# Patient Record
Sex: Male | Born: 1970 | Race: White | Hispanic: No | Smoking: Current every day smoker
Health system: Southern US, Community
[De-identification: ages and names within clinical notes are randomized; demographics above are authoritative.]

## PROBLEM LIST (undated history)

## (undated) DIAGNOSIS — G8929 Other chronic pain: Secondary | ICD-10-CM

## (undated) DIAGNOSIS — I10 Essential (primary) hypertension: Secondary | ICD-10-CM

## (undated) DIAGNOSIS — F419 Anxiety disorder, unspecified: Secondary | ICD-10-CM

## (undated) DIAGNOSIS — M549 Dorsalgia, unspecified: Secondary | ICD-10-CM

## (undated) DIAGNOSIS — R569 Unspecified convulsions: Secondary | ICD-10-CM

## (undated) DIAGNOSIS — F329 Major depressive disorder, single episode, unspecified: Secondary | ICD-10-CM

## (undated) DIAGNOSIS — R4589 Other symptoms and signs involving emotional state: Secondary | ICD-10-CM

## (undated) DIAGNOSIS — R4689 Other symptoms and signs involving appearance and behavior: Secondary | ICD-10-CM

## (undated) DIAGNOSIS — F32A Depression, unspecified: Secondary | ICD-10-CM

## (undated) HISTORY — DX: Depression, unspecified: F32.A

## (undated) HISTORY — DX: Major depressive disorder, single episode, unspecified: F32.9

---

## 2006-05-01 ENCOUNTER — Emergency Department (HOSPITAL_COMMUNITY): Admission: EM | Admit: 2006-05-01 | Discharge: 2006-05-02 | Payer: Self-pay | Admitting: Emergency Medicine

## 2006-08-31 ENCOUNTER — Emergency Department (HOSPITAL_COMMUNITY): Admission: EM | Admit: 2006-08-31 | Discharge: 2006-08-31 | Payer: Self-pay | Admitting: Emergency Medicine

## 2006-09-22 ENCOUNTER — Emergency Department (HOSPITAL_COMMUNITY): Admission: EM | Admit: 2006-09-22 | Discharge: 2006-09-23 | Payer: Self-pay | Admitting: *Deleted

## 2007-10-03 ENCOUNTER — Emergency Department (HOSPITAL_COMMUNITY): Admission: EM | Admit: 2007-10-03 | Discharge: 2007-10-03 | Payer: Self-pay | Admitting: Emergency Medicine

## 2007-12-28 ENCOUNTER — Emergency Department (HOSPITAL_COMMUNITY): Admission: EM | Admit: 2007-12-28 | Discharge: 2007-12-28 | Payer: Self-pay | Admitting: Emergency Medicine

## 2008-02-09 ENCOUNTER — Emergency Department (HOSPITAL_COMMUNITY): Admission: EM | Admit: 2008-02-09 | Discharge: 2008-02-10 | Payer: Self-pay | Admitting: Emergency Medicine

## 2008-02-10 ENCOUNTER — Ambulatory Visit: Payer: Self-pay | Admitting: *Deleted

## 2008-04-02 ENCOUNTER — Emergency Department (HOSPITAL_COMMUNITY): Admission: EM | Admit: 2008-04-02 | Discharge: 2008-04-03 | Payer: Self-pay | Admitting: Emergency Medicine

## 2008-12-14 ENCOUNTER — Emergency Department (HOSPITAL_COMMUNITY): Admission: EM | Admit: 2008-12-14 | Discharge: 2008-12-14 | Payer: Self-pay | Admitting: Emergency Medicine

## 2008-12-17 ENCOUNTER — Emergency Department (HOSPITAL_COMMUNITY): Admission: EM | Admit: 2008-12-17 | Discharge: 2008-12-18 | Payer: Self-pay | Admitting: Emergency Medicine

## 2009-01-30 ENCOUNTER — Ambulatory Visit (HOSPITAL_COMMUNITY): Payer: Self-pay | Admitting: Psychiatry

## 2009-06-05 ENCOUNTER — Encounter: Admission: RE | Admit: 2009-06-05 | Discharge: 2009-08-26 | Payer: Self-pay | Admitting: Neurology

## 2010-07-16 ENCOUNTER — Emergency Department (HOSPITAL_COMMUNITY): Admission: EM | Admit: 2010-07-16 | Discharge: 2010-07-16 | Payer: Self-pay | Admitting: Emergency Medicine

## 2010-07-18 ENCOUNTER — Emergency Department (HOSPITAL_BASED_OUTPATIENT_CLINIC_OR_DEPARTMENT_OTHER): Admission: EM | Admit: 2010-07-18 | Discharge: 2010-07-18 | Payer: Self-pay | Admitting: Emergency Medicine

## 2010-07-18 ENCOUNTER — Ambulatory Visit: Payer: Self-pay | Admitting: Diagnostic Radiology

## 2010-09-21 ENCOUNTER — Emergency Department (HOSPITAL_COMMUNITY)
Admission: EM | Admit: 2010-09-21 | Discharge: 2010-09-22 | Disposition: A | Discharge status: Other Acute Inpatient Hospital | Payer: Self-pay | Source: Home / Self Care | Admitting: Emergency Medicine

## 2010-09-22 ENCOUNTER — Ambulatory Visit: Payer: Self-pay | Admitting: Psychiatry

## 2010-09-22 ENCOUNTER — Inpatient Hospital Stay (HOSPITAL_COMMUNITY): Admission: AD | Admit: 2010-09-22 | Discharge: 2010-09-28 | Payer: Self-pay | Admitting: Psychiatry

## 2010-09-29 ENCOUNTER — Other Ambulatory Visit (HOSPITAL_COMMUNITY)
Admission: RE | Admit: 2010-09-29 | Discharge: 2010-10-12 | Payer: Self-pay | Source: Home / Self Care | Admitting: Psychiatry

## 2010-10-13 ENCOUNTER — Ambulatory Visit: Payer: Self-pay | Admitting: Psychiatry

## 2011-02-01 LAB — DIFFERENTIAL
Basophils Relative: 1 % (ref 0–1)
Eosinophils Absolute: 0.7 10*3/uL (ref 0.0–0.7)
Eosinophils Relative: 7 % — ABNORMAL HIGH (ref 0–5)
Lymphs Abs: 3.3 10*3/uL (ref 0.7–4.0)
Monocytes Absolute: 0.7 10*3/uL (ref 0.1–1.0)
Monocytes Relative: 7 % (ref 3–12)

## 2011-02-01 LAB — BASIC METABOLIC PANEL
CO2: 22 mEq/L (ref 19–32)
Calcium: 9.3 mg/dL (ref 8.4–10.5)
Chloride: 102 mEq/L (ref 96–112)
Glucose, Bld: 111 mg/dL — ABNORMAL HIGH (ref 70–99)
Potassium: 3.7 mEq/L (ref 3.5–5.1)
Sodium: 137 mEq/L (ref 135–145)

## 2011-02-01 LAB — CBC
HCT: 37.5 % — ABNORMAL LOW (ref 39.0–52.0)
Hemoglobin: 12.4 g/dL — ABNORMAL LOW (ref 13.0–17.0)
MCH: 27.8 pg (ref 26.0–34.0)
MCHC: 33.2 g/dL (ref 30.0–36.0)
MCV: 83.8 fL (ref 78.0–100.0)
RBC: 4.48 MIL/uL (ref 4.22–5.81)

## 2011-02-01 LAB — RAPID URINE DRUG SCREEN, HOSP PERFORMED
Amphetamines: NOT DETECTED
Barbiturates: NOT DETECTED
Opiates: POSITIVE — AB

## 2011-02-01 LAB — HEPATIC FUNCTION PANEL
ALT: 22 U/L (ref 0–53)
Alkaline Phosphatase: 79 U/L (ref 39–117)
Indirect Bilirubin: 0.3 mg/dL (ref 0.3–0.9)
Total Bilirubin: 0.4 mg/dL (ref 0.3–1.2)

## 2011-02-01 LAB — T3, FREE: T3, Free: 3.6 pg/mL (ref 2.3–4.2)

## 2011-02-04 LAB — DIFFERENTIAL
Basophils Absolute: 0.1 10*3/uL (ref 0.0–0.1)
Eosinophils Absolute: 0.8 10*3/uL — ABNORMAL HIGH (ref 0.0–0.7)
Eosinophils Relative: 9 % — ABNORMAL HIGH (ref 0–5)
Monocytes Absolute: 0.6 10*3/uL (ref 0.1–1.0)

## 2011-02-04 LAB — CBC
HCT: 36.8 % — ABNORMAL LOW (ref 39.0–52.0)
MCH: 26.1 pg (ref 26.0–34.0)
MCHC: 32.6 g/dL (ref 30.0–36.0)
MCV: 80 fL (ref 78.0–100.0)
Platelets: 322 10*3/uL (ref 150–400)
RDW: 16.2 % — ABNORMAL HIGH (ref 11.5–15.5)
WBC: 9.2 10*3/uL (ref 4.0–10.5)

## 2011-02-04 LAB — RAPID URINE DRUG SCREEN, HOSP PERFORMED
Amphetamines: NOT DETECTED
Barbiturates: NOT DETECTED
Benzodiazepines: POSITIVE — AB
Opiates: POSITIVE — AB
Tetrahydrocannabinol: NOT DETECTED

## 2011-02-04 LAB — URINALYSIS, ROUTINE W REFLEX MICROSCOPIC
Bilirubin Urine: NEGATIVE
Glucose, UA: NEGATIVE mg/dL
Hgb urine dipstick: NEGATIVE
Protein, ur: NEGATIVE mg/dL
Urobilinogen, UA: 0.2 mg/dL (ref 0.0–1.0)

## 2011-02-04 LAB — POCT I-STAT, CHEM 8
BUN: 15 mg/dL (ref 6–23)
Calcium, Ion: 1.16 mmol/L (ref 1.12–1.32)
Hemoglobin: 13.6 g/dL (ref 13.0–17.0)
Sodium: 139 mEq/L (ref 135–145)
TCO2: 24 mmol/L (ref 0–100)

## 2011-03-07 LAB — COMPREHENSIVE METABOLIC PANEL WITH GFR
Albumin: 4 g/dL (ref 3.5–5.2)
BUN: 13 mg/dL (ref 6–23)
Calcium: 8.9 mg/dL (ref 8.4–10.5)
Creatinine, Ser: 0.9 mg/dL (ref 0.4–1.5)
Potassium: 3.4 meq/L — ABNORMAL LOW (ref 3.5–5.1)
Total Protein: 6.3 g/dL (ref 6.0–8.3)

## 2011-03-07 LAB — RAPID URINE DRUG SCREEN, HOSP PERFORMED
Amphetamines: NOT DETECTED
Amphetamines: NOT DETECTED
Barbiturates: NOT DETECTED
Barbiturates: NOT DETECTED
Benzodiazepines: POSITIVE — AB
Cocaine: POSITIVE — AB
Cocaine: POSITIVE — AB
Opiates: POSITIVE — AB
Opiates: POSITIVE — AB
Tetrahydrocannabinol: NOT DETECTED
Tetrahydrocannabinol: NOT DETECTED

## 2011-03-07 LAB — URINALYSIS, ROUTINE W REFLEX MICROSCOPIC
Bilirubin Urine: NEGATIVE
Glucose, UA: NEGATIVE mg/dL
Hgb urine dipstick: NEGATIVE
Ketones, ur: NEGATIVE mg/dL
Nitrite: NEGATIVE
Protein, ur: NEGATIVE mg/dL
Specific Gravity, Urine: 1.022 (ref 1.005–1.030)
Urobilinogen, UA: 0.2 mg/dL (ref 0.0–1.0)
pH: 6.5 (ref 5.0–8.0)

## 2011-03-07 LAB — BASIC METABOLIC PANEL
CO2: 28 mEq/L (ref 19–32)
Calcium: 10.3 mg/dL (ref 8.4–10.5)
Chloride: 97 mEq/L (ref 96–112)
Creatinine, Ser: 1.12 mg/dL (ref 0.4–1.5)
Glucose, Bld: 110 mg/dL — ABNORMAL HIGH (ref 70–99)

## 2011-03-07 LAB — DIFFERENTIAL
Basophils Absolute: 0 10*3/uL (ref 0.0–0.1)
Basophils Absolute: 0 K/uL (ref 0.0–0.1)
Basophils Relative: 0 % (ref 0–1)
Basophils Relative: 1 % (ref 0–1)
Eosinophils Absolute: 0.2 10*3/uL (ref 0.0–0.7)
Eosinophils Absolute: 0.4 10*3/uL (ref 0.0–0.7)
Eosinophils Relative: 5 % (ref 0–5)
Lymphocytes Relative: 28 % (ref 12–46)
Lymphs Abs: 2.5 K/uL (ref 0.7–4.0)
Monocytes Absolute: 0.6 K/uL (ref 0.1–1.0)
Monocytes Absolute: 0.9 10*3/uL (ref 0.1–1.0)
Monocytes Relative: 7 % (ref 3–12)
Monocytes Relative: 8 % (ref 3–12)
Neutro Abs: 5.4 K/uL (ref 1.7–7.7)
Neutro Abs: 8.5 10*3/uL — ABNORMAL HIGH (ref 1.7–7.7)
Neutrophils Relative %: 61 % (ref 43–77)

## 2011-03-07 LAB — COMPREHENSIVE METABOLIC PANEL
ALT: 20 U/L (ref 0–53)
AST: 21 U/L (ref 0–37)
Alkaline Phosphatase: 54 U/L (ref 39–117)
CO2: 27 mEq/L (ref 19–32)
Chloride: 102 mEq/L (ref 96–112)
GFR calc Af Amer: >60 mL/min (ref >60)
GFR calc non Af Amer: >60 mL/min (ref >60)
Glucose, Bld: 88 mg/dL (ref 70–99)
Sodium: 137 mEq/L (ref 135–145)
Total Bilirubin: 0.7 mg/dL (ref 0.3–1.2)

## 2011-03-07 LAB — CBC
HCT: 36.9 % — ABNORMAL LOW (ref 39.0–52.0)
Hemoglobin: 12.4 g/dL — ABNORMAL LOW (ref 13.0–17.0)
Hemoglobin: 14.8 g/dL (ref 13.0–17.0)
MCHC: 33.2 g/dL (ref 30.0–36.0)
MCHC: 33.5 g/dL (ref 30.0–36.0)
MCV: 91.4 fL (ref 78.0–100.0)
MCV: 92 fL (ref 78.0–100.0)
Platelets: 293 K/uL (ref 150–400)
RBC: 4.01 MIL/uL — ABNORMAL LOW (ref 4.22–5.81)
RDW: 12.9 % (ref 11.5–15.5)
RDW: 13.4 % (ref 11.5–15.5)
WBC: 8.9 10*3/uL (ref 4.0–10.5)

## 2011-03-07 LAB — LIPASE, BLOOD: Lipase: 36 U/L (ref 11–59)

## 2011-03-23 ENCOUNTER — Emergency Department (HOSPITAL_COMMUNITY)
Admission: EM | Admit: 2011-03-23 | Discharge: 2011-03-24 | Disposition: A | Discharge status: Other Acute Inpatient Hospital | Payer: PRIVATE HEALTH INSURANCE | Attending: Emergency Medicine | Admitting: Emergency Medicine

## 2011-03-23 DIAGNOSIS — M545 Low back pain, unspecified: Secondary | ICD-10-CM | POA: Insufficient documentation

## 2011-03-23 DIAGNOSIS — I10 Essential (primary) hypertension: Secondary | ICD-10-CM | POA: Insufficient documentation

## 2011-03-23 DIAGNOSIS — F3289 Other specified depressive episodes: Secondary | ICD-10-CM | POA: Insufficient documentation

## 2011-03-23 DIAGNOSIS — F329 Major depressive disorder, single episode, unspecified: Secondary | ICD-10-CM | POA: Insufficient documentation

## 2011-03-23 DIAGNOSIS — G40909 Epilepsy, unspecified, not intractable, without status epilepticus: Secondary | ICD-10-CM | POA: Insufficient documentation

## 2011-03-23 LAB — URINALYSIS, ROUTINE W REFLEX MICROSCOPIC
Bilirubin Urine: NEGATIVE
Hgb urine dipstick: NEGATIVE
Ketones, ur: NEGATIVE mg/dL
Nitrite: NEGATIVE
Protein, ur: NEGATIVE mg/dL
Specific Gravity, Urine: 1.027 (ref 1.005–1.030)
Urobilinogen, UA: 0.2 mg/dL (ref 0.0–1.0)

## 2011-03-23 LAB — CBC
HCT: 37 % — ABNORMAL LOW (ref 39.0–52.0)
Hemoglobin: 12.4 g/dL — ABNORMAL LOW (ref 13.0–17.0)
MCV: 81.3 fL (ref 78.0–100.0)
RBC: 4.55 MIL/uL (ref 4.22–5.81)
RDW: 15.1 % (ref 11.5–15.5)
WBC: 10.8 10*3/uL — ABNORMAL HIGH (ref 4.0–10.5)

## 2011-03-23 LAB — DIFFERENTIAL
Basophils Absolute: 0.1 10*3/uL (ref 0.0–0.1)
Lymphocytes Relative: 28 % (ref 12–46)
Lymphs Abs: 3 10*3/uL (ref 0.7–4.0)
Neutro Abs: 6.2 10*3/uL (ref 1.7–7.7)
Neutrophils Relative %: 57 % (ref 43–77)

## 2011-03-23 LAB — RAPID URINE DRUG SCREEN, HOSP PERFORMED
Amphetamines: NOT DETECTED
Opiates: NOT DETECTED
Tetrahydrocannabinol: NOT DETECTED

## 2011-03-24 ENCOUNTER — Inpatient Hospital Stay (HOSPITAL_COMMUNITY)
Admission: AD | Admit: 2011-03-24 | Discharge: 2011-03-28 | DRG: 897 | Disposition: A | Discharge status: Home / Self Care | Payer: PRIVATE HEALTH INSURANCE | Source: Ambulatory Visit | Attending: Psychiatry | Admitting: Psychiatry

## 2011-03-24 DIAGNOSIS — G8929 Other chronic pain: Secondary | ICD-10-CM

## 2011-03-24 DIAGNOSIS — F3289 Other specified depressive episodes: Secondary | ICD-10-CM

## 2011-03-24 DIAGNOSIS — G9389 Other specified disorders of brain: Secondary | ICD-10-CM

## 2011-03-24 DIAGNOSIS — F1994 Other psychoactive substance use, unspecified with psychoactive substance-induced mood disorder: Secondary | ICD-10-CM

## 2011-03-24 DIAGNOSIS — Z87820 Personal history of traumatic brain injury: Secondary | ICD-10-CM

## 2011-03-24 DIAGNOSIS — F1411 Cocaine abuse, in remission: Secondary | ICD-10-CM

## 2011-03-24 DIAGNOSIS — F1120 Opioid dependence, uncomplicated: Principal | ICD-10-CM

## 2011-03-24 DIAGNOSIS — F1211 Cannabis abuse, in remission: Secondary | ICD-10-CM

## 2011-03-24 DIAGNOSIS — G40909 Epilepsy, unspecified, not intractable, without status epilepticus: Secondary | ICD-10-CM

## 2011-03-24 DIAGNOSIS — F329 Major depressive disorder, single episode, unspecified: Secondary | ICD-10-CM

## 2011-03-24 DIAGNOSIS — Z560 Unemployment, unspecified: Secondary | ICD-10-CM

## 2011-03-24 DIAGNOSIS — M549 Dorsalgia, unspecified: Secondary | ICD-10-CM

## 2011-03-24 DIAGNOSIS — F17200 Nicotine dependence, unspecified, uncomplicated: Secondary | ICD-10-CM

## 2011-03-24 LAB — COMPREHENSIVE METABOLIC PANEL
ALT: 24 U/L (ref 0–53)
Alkaline Phosphatase: 79 U/L (ref 39–117)
BUN: 13 mg/dL (ref 6–23)
Chloride: 105 mEq/L (ref 96–112)
Glucose, Bld: 132 mg/dL — ABNORMAL HIGH (ref 70–99)
Potassium: 4.3 mEq/L (ref 3.5–5.1)
Sodium: 139 mEq/L (ref 135–145)
Total Bilirubin: 0.1 mg/dL — ABNORMAL LOW (ref 0.3–1.2)
Total Protein: 7.2 g/dL (ref 6.0–8.3)

## 2011-03-24 LAB — ETHANOL: Alcohol, Ethyl (B): <11 mg/dL — ABNORMAL HIGH (ref 0–10)

## 2011-03-25 DIAGNOSIS — F1920 Other psychoactive substance dependence, uncomplicated: Secondary | ICD-10-CM

## 2011-03-25 DIAGNOSIS — F1994 Other psychoactive substance use, unspecified with psychoactive substance-induced mood disorder: Secondary | ICD-10-CM

## 2011-03-25 LAB — HEPATIC FUNCTION PANEL
ALT: 25 U/L (ref 0–53)
Alkaline Phosphatase: 78 U/L (ref 39–117)
Bilirubin, Direct: 0.1 mg/dL (ref 0.0–0.3)
Indirect Bilirubin: 0.2 mg/dL — ABNORMAL LOW (ref 0.3–0.9)
Total Protein: 7.1 g/dL (ref 6.0–8.3)

## 2011-03-25 LAB — HEMOGLOBIN A1C: Mean Plasma Glucose: 120 mg/dL — ABNORMAL HIGH (ref <117)

## 2011-03-25 LAB — TSH: TSH: 0.626 u[IU]/mL (ref 0.350–4.500)
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
## 2011-03-29 NOTE — Discharge Summary (Signed)
NAMEMICO, SPARK NO.:  [id number]  MEDICAL RECORD NO.:  [id number]           PATIENT TYPE:  I  LOCATION:  0305                          FACILITY:  BH  PHYSICIAN:  Anselm Jungling, MD  DATE OF BIRTH:  [birth year]  DATE OF ADMISSION:  03/24/2011 DATE OF DISCHARGE:  03/28/2011                              DISCHARGE SUMMARY   IDENTIFYING DATA AND REASON FOR ADMISSION:  This was an inpatient psychiatric admission for Sherley, a 40 year old male, who was admitted for stabilization of mood disorder, and substance dependence.  Please refer to the admission note prepared by Dr. Allena Katz, for further details pertaining to the symptoms, circumstances and history that led to his hospitalization.  MEDICAL AND LABORATORY:  The patient was medically and physically assessed by the psychiatric nurse practitioner.  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 In addition, from the same injury event, he had significant spinal damage resulting in chronic pain.  This was addressed with Neurontin, Lidoderm patch, and naproxen.  The patient had been receiving opiate analgesia through a pain clinic prior to admission.  It was not clear to what degree he had been abusing or misusing these substances, intentionally or otherwise.  The patient was also treated with risperidone, Keppra and carvedilol. There were no acute medical issues during his stay.  HOSPITAL COURSE:  The patient was admitted to the adult inpatient psychiatric service.  He presented as a well-nourished, normally- developed adult male, who was quite pleasant, and generally well- organized.  He had good insight into his issues, including those pertaining to his traumatic brain injury.  He participated in therapeutic groups and activities geared towards helping him acquire better coping skills, a  better understanding of his underlying disorders and dynamics, and the development of a sound aftercare plan.  He was a good participant throughout.  On the fifth hospital day, he appeared appropriate for discharge.  He had gone through a clonidine withdrawal protocol, and it had proceeded and completed uneventfully.  He agreed to the following aftercare plan.  AFTERCARE:  The patient was to follow-up with his usual provider, Cora Collum, with an appointment on the day following discharge, Mar 29, 2011, at 1:45 p.m., for medication management.  Also, he was to follow-up with his usual therapist, Colen Darling, to be arranged at the time of this dictation.  DISCHARGE MEDICATIONS: 1. Carvedilol 12.5 mg b.i.d. 2. Naproxen 500 mg b.i.d. 3. Risperidone 0.5 mg b.i.d. 4. Keppra XR 500 mg b.i.d.  DISCHARGE DIAGNOSES:  Axis I:  Opiate dependence, not otherwise specified.  Depressive disorder, not otherwise specified. Axis II:  Deferred. Axis III:  History of traumatic brain injury, spinal injury, with associated sequelae, chronic pain. Axis IV:  Stressors severe. Axis V:  Global Assessment of Functioning on discharge 50.  The suicide risk assessment was completed at the time of discharge and he was rated as having minimal risk.     Anselm Jungling, MD     SPB/MEDQ  D:  03/28/2011  T:  03/29/2011  Job:  [id number]  Electronically Signed by Geralyn Flash MD on 03/29/2011 10:51:18 AM

## 2011-08-15 LAB — SALICYLATE LEVEL: Salicylate Lvl: <4

## 2011-08-15 LAB — I-STAT 8, (EC8 V) (CONVERTED LAB)
BUN: 19
Glucose, Bld: 96
Hemoglobin: 14.3
Potassium: 4
Sodium: 138
TCO2: 26

## 2011-08-15 LAB — RAPID URINE DRUG SCREEN, HOSP PERFORMED
Amphetamines: NOT DETECTED
Barbiturates: NOT DETECTED

## 2011-08-15 LAB — ACETAMINOPHEN LEVEL: Acetaminophen (Tylenol), Serum: <10 — ABNORMAL LOW

## 2011-08-15 LAB — ETHANOL: Alcohol, Ethyl (B): <5

## 2011-08-30 LAB — DIFFERENTIAL
Basophils Absolute: 0
Lymphocytes Relative: 19
Neutro Abs: 7.1

## 2011-08-30 LAB — BASIC METABOLIC PANEL
BUN: 12
Calcium: 9.8
Creatinine, Ser: 1.03
GFR calc non Af Amer: >60
Glucose, Bld: 96

## 2011-08-30 LAB — URINALYSIS, ROUTINE W REFLEX MICROSCOPIC
Glucose, UA: NEGATIVE
Ketones, ur: NEGATIVE
Leukocytes, UA: NEGATIVE
Protein, ur: 100 — AB

## 2011-08-30 LAB — CBC
Platelets: 431 — ABNORMAL HIGH
RDW: 13

## 2011-08-30 LAB — RAPID URINE DRUG SCREEN, HOSP PERFORMED
Amphetamines: NOT DETECTED
Barbiturates: NOT DETECTED
Benzodiazepines: POSITIVE — AB
Cocaine: NOT DETECTED
Opiates: NOT DETECTED

## 2011-08-30 LAB — VALPROIC ACID LEVEL: Valproic Acid Lvl: <10 — ABNORMAL LOW

## 2012-01-01 ENCOUNTER — Encounter (HOSPITAL_COMMUNITY): Payer: Self-pay

## 2012-01-01 ENCOUNTER — Inpatient Hospital Stay (HOSPITAL_COMMUNITY)
Admission: EM | Admit: 2012-01-01 | Discharge: 2012-01-04 | DRG: 917 | Disposition: A | Discharge status: Home / Self Care | Payer: Medicare Other | Attending: Internal Medicine | Admitting: Internal Medicine

## 2012-01-01 ENCOUNTER — Emergency Department (HOSPITAL_COMMUNITY): Payer: Medicare Other

## 2012-01-01 ENCOUNTER — Other Ambulatory Visit: Payer: Self-pay

## 2012-01-01 DIAGNOSIS — F32A Depression, unspecified: Secondary | ICD-10-CM | POA: Diagnosis present

## 2012-01-01 DIAGNOSIS — Z87311 Personal history of (healed) other pathological fracture: Secondary | ICD-10-CM

## 2012-01-01 DIAGNOSIS — I10 Essential (primary) hypertension: Secondary | ICD-10-CM | POA: Diagnosis present

## 2012-01-01 DIAGNOSIS — F17200 Nicotine dependence, unspecified, uncomplicated: Secondary | ICD-10-CM | POA: Diagnosis present

## 2012-01-01 DIAGNOSIS — E876 Hypokalemia: Secondary | ICD-10-CM | POA: Diagnosis present

## 2012-01-01 DIAGNOSIS — T424X4A Poisoning by benzodiazepines, undetermined, initial encounter: Principal | ICD-10-CM | POA: Diagnosis present

## 2012-01-01 DIAGNOSIS — J45901 Unspecified asthma with (acute) exacerbation: Secondary | ICD-10-CM | POA: Diagnosis present

## 2012-01-01 DIAGNOSIS — D72829 Elevated white blood cell count, unspecified: Secondary | ICD-10-CM | POA: Diagnosis present

## 2012-01-01 DIAGNOSIS — F329 Major depressive disorder, single episode, unspecified: Secondary | ICD-10-CM | POA: Diagnosis present

## 2012-01-01 DIAGNOSIS — R0781 Pleurodynia: Secondary | ICD-10-CM | POA: Diagnosis present

## 2012-01-01 DIAGNOSIS — T43591A Poisoning by other antipsychotics and neuroleptics, accidental (unintentional), initial encounter: Secondary | ICD-10-CM | POA: Diagnosis present

## 2012-01-01 DIAGNOSIS — T424X1A Poisoning by benzodiazepines, accidental (unintentional), initial encounter: Secondary | ICD-10-CM | POA: Diagnosis present

## 2012-01-01 DIAGNOSIS — J189 Pneumonia, unspecified organism: Secondary | ICD-10-CM | POA: Diagnosis present

## 2012-01-01 DIAGNOSIS — T50901A Poisoning by unspecified drugs, medicaments and biological substances, accidental (unintentional), initial encounter: Secondary | ICD-10-CM

## 2012-01-01 DIAGNOSIS — F411 Generalized anxiety disorder: Secondary | ICD-10-CM | POA: Diagnosis present

## 2012-01-01 DIAGNOSIS — F419 Anxiety disorder, unspecified: Secondary | ICD-10-CM | POA: Diagnosis present

## 2012-01-01 HISTORY — DX: Unspecified convulsions: R56.9

## 2012-01-01 HISTORY — DX: Anxiety disorder, unspecified: F41.9

## 2012-01-01 HISTORY — DX: Other symptoms and signs involving emotional state: R45.89

## 2012-01-01 HISTORY — DX: Essential (primary) hypertension: I10

## 2012-01-01 HISTORY — DX: Other symptoms and signs involving appearance and behavior: R46.89

## 2012-01-01 LAB — COMPREHENSIVE METABOLIC PANEL
ALT: 45 U/L (ref 0–53)
AST: 27 U/L (ref 0–37)
Calcium: 9.7 mg/dL (ref 8.4–10.5)
Creatinine, Ser: 0.8 mg/dL (ref 0.50–1.35)
GFR calc Af Amer: >90 mL/min (ref >90)
Glucose, Bld: 94 mg/dL (ref 70–99)
Sodium: 143 mEq/L (ref 135–145)
Total Protein: 7.7 g/dL (ref 6.0–8.3)

## 2012-01-01 LAB — URINALYSIS, ROUTINE W REFLEX MICROSCOPIC
Glucose, UA: NEGATIVE mg/dL
Leukocytes, UA: NEGATIVE
Nitrite: NEGATIVE
Specific Gravity, Urine: 1.027 (ref 1.005–1.030)
pH: 6 (ref 5.0–8.0)

## 2012-01-01 LAB — CBC
MCV: 83.9 fL (ref 78.0–100.0)
Platelets: 388 10*3/uL (ref 150–400)
RBC: 4.84 MIL/uL (ref 4.22–5.81)
RDW: 13.6 % (ref 11.5–15.5)
WBC: 12 10*3/uL — ABNORMAL HIGH (ref 4.0–10.5)

## 2012-01-01 LAB — GLUCOSE, CAPILLARY
Glucose-Capillary: 101 mg/dL — ABNORMAL HIGH (ref 70–99)
Glucose-Capillary: 116 mg/dL — ABNORMAL HIGH (ref 70–99)

## 2012-01-01 LAB — RAPID URINE DRUG SCREEN, HOSP PERFORMED
Amphetamines: NOT DETECTED
Barbiturates: NOT DETECTED
Tetrahydrocannabinol: NOT DETECTED

## 2012-01-01 LAB — DIFFERENTIAL
Basophils Absolute: 0.1 10*3/uL (ref 0.0–0.1)
Lymphocytes Relative: 19 % (ref 12–46)
Lymphs Abs: 2.3 10*3/uL (ref 0.7–4.0)
Neutro Abs: 8.6 10*3/uL — ABNORMAL HIGH (ref 1.7–7.7)
Neutrophils Relative %: 71 % (ref 43–77)

## 2012-01-01 LAB — SALICYLATE LEVEL: Salicylate Lvl: <2 mg/dL — ABNORMAL LOW (ref 2.8–20.0)

## 2012-01-01 LAB — MRSA PCR SCREENING: MRSA by PCR: NEGATIVE

## 2012-01-01 MED ORDER — ONDANSETRON HCL 4 MG PO TABS: 4.0000 mg | ORAL_TABLET | Freq: Four times a day (QID) | ORAL | Status: DC | PRN

## 2012-01-01 MED ORDER — SODIUM CHLORIDE 0.9 % IV SOLN
INTRAVENOUS | Status: AC
  Administered 2012-01-01: 15:00:00 via INTRAVENOUS

## 2012-01-01 MED ORDER — DEXTROSE-NACL 5-0.45 % IV SOLN
INTRAVENOUS | Status: DC
  Administered 2012-01-01 – 2012-01-02 (×2): via INTRAVENOUS

## 2012-01-01 MED ORDER — SODIUM CHLORIDE 0.9 % IV SOLN
INTRAVENOUS | Status: DC
  Administered 2012-01-01 (×2): via INTRAVENOUS

## 2012-01-01 MED ORDER — ALBUTEROL SULFATE (5 MG/ML) 0.5% IN NEBU
2.5000 mg | INHALATION_SOLUTION | Freq: Four times a day (QID) | RESPIRATORY_TRACT | Status: DC
  Administered 2012-01-01 – 2012-01-02 (×4): 2.5 mg via RESPIRATORY_TRACT
  Filled 2012-01-01 (×3): qty 0.5

## 2012-01-01 MED ORDER — METOPROLOL SUCCINATE ER 50 MG PO TB24
50.0000 mg | ORAL_TABLET | Freq: Every day | ORAL | Status: DC
  Administered 2012-01-01 – 2012-01-04 (×4): 50 mg via ORAL
  Filled 2012-01-01 (×6): qty 1

## 2012-01-01 MED ORDER — LEVETIRACETAM ER 500 MG PO TB24
1000.0000 mg | ORAL_TABLET | Freq: Every day | ORAL | Status: DC
  Administered 2012-01-01 – 2012-01-04 (×4): 1000 mg via ORAL
  Filled 2012-01-01 (×5): qty 2

## 2012-01-01 MED ORDER — LEVOFLOXACIN IN D5W 750 MG/150ML IV SOLN
750.0000 mg | INTRAVENOUS | Status: DC
  Administered 2012-01-01 – 2012-01-02 (×2): 750 mg via INTRAVENOUS
  Filled 2012-01-01 (×3): qty 150

## 2012-01-01 MED ORDER — METOPROLOL TARTRATE 1 MG/ML IV SOLN
5.0000 mg | Freq: Four times a day (QID) | INTRAVENOUS | Status: DC | PRN
  Administered 2012-01-01 – 2012-01-02 (×2): 5 mg via INTRAVENOUS
  Filled 2012-01-01 (×3): qty 5

## 2012-01-01 MED ORDER — ENOXAPARIN SODIUM 40 MG/0.4ML ~~LOC~~ SOLN
40.0000 mg | SUBCUTANEOUS | Status: DC
  Administered 2012-01-01 – 2012-01-03 (×3): 40 mg via SUBCUTANEOUS
  Filled 2012-01-01 (×5): qty 0.4

## 2012-01-01 MED ORDER — BUDESONIDE-FORMOTEROL FUMARATE 80-4.5 MCG/ACT IN AERO
2.0000 | INHALATION_SPRAY | Freq: Two times a day (BID) | RESPIRATORY_TRACT | Status: DC
  Administered 2012-01-01 – 2012-01-04 (×4): 2 via RESPIRATORY_TRACT
  Filled 2012-01-01: qty 6.9

## 2012-01-01 MED ORDER — ALBUTEROL SULFATE (5 MG/ML) 0.5% IN NEBU: 2.5000 mg | INHALATION_SOLUTION | RESPIRATORY_TRACT | Status: DC | PRN

## 2012-01-01 MED ORDER — ONDANSETRON HCL 4 MG/2ML IJ SOLN: 4.0000 mg | Freq: Four times a day (QID) | INTRAMUSCULAR | Status: DC | PRN

## 2012-01-01 MED ORDER — IPRATROPIUM BROMIDE 0.02 % IN SOLN
0.5000 mg | Freq: Four times a day (QID) | RESPIRATORY_TRACT | Status: DC
  Administered 2012-01-01 – 2012-01-02 (×4): 0.5 mg via RESPIRATORY_TRACT
  Filled 2012-01-01 (×3): qty 2.5

## 2012-01-01 NOTE — ED Notes (Signed)
Pt got the xanax prescription Friday night. Family member found pt around 1000 this AM lethargic, and the prescription bottle is almost empty. Dad states pt took 94pill so 0.5 xanax. Pt is lethargic but following command.
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
## 2012-01-02 ENCOUNTER — Inpatient Hospital Stay (HOSPITAL_COMMUNITY): Payer: Medicare Other

## 2012-01-02 DIAGNOSIS — F329 Major depressive disorder, single episode, unspecified: Secondary | ICD-10-CM | POA: Diagnosis present

## 2012-01-02 DIAGNOSIS — D72829 Elevated white blood cell count, unspecified: Secondary | ICD-10-CM | POA: Diagnosis present

## 2012-01-02 DIAGNOSIS — I10 Essential (primary) hypertension: Secondary | ICD-10-CM | POA: Diagnosis present

## 2012-01-02 DIAGNOSIS — F32A Depression, unspecified: Secondary | ICD-10-CM | POA: Diagnosis present

## 2012-01-02 DIAGNOSIS — F419 Anxiety disorder, unspecified: Secondary | ICD-10-CM | POA: Diagnosis present

## 2012-01-02 DIAGNOSIS — J189 Pneumonia, unspecified organism: Secondary | ICD-10-CM | POA: Diagnosis present

## 2012-01-02 DIAGNOSIS — E876 Hypokalemia: Secondary | ICD-10-CM | POA: Diagnosis present

## 2012-01-02 LAB — COMPREHENSIVE METABOLIC PANEL
AST: 19 U/L (ref 0–37)
CO2: 22 mEq/L (ref 19–32)
Calcium: 8.7 mg/dL (ref 8.4–10.5)
Creatinine, Ser: 0.82 mg/dL (ref 0.50–1.35)
GFR calc Af Amer: >90 mL/min (ref >90)
GFR calc non Af Amer: >90 mL/min (ref >90)

## 2012-01-02 LAB — CBC
Hemoglobin: 11.1 g/dL — ABNORMAL LOW (ref 13.0–17.0)
MCH: 27.9 pg (ref 26.0–34.0)
RBC: 3.98 MIL/uL — ABNORMAL LOW (ref 4.22–5.81)
WBC: 12.2 10*3/uL — ABNORMAL HIGH (ref 4.0–10.5)

## 2012-01-02 LAB — GLUCOSE, CAPILLARY: Glucose-Capillary: 125 mg/dL — ABNORMAL HIGH (ref 70–99)

## 2012-01-02 MED ORDER — ACETAMINOPHEN 325 MG PO TABS
650.0000 mg | ORAL_TABLET | Freq: Four times a day (QID) | ORAL | Status: DC | PRN
  Administered 2012-01-02 (×2): 650 mg via ORAL
  Filled 2012-01-02 (×2): qty 2

## 2012-01-02 MED ORDER — ALPRAZOLAM ER 0.5 MG PO TB24
0.5000 mg | ORAL_TABLET | Freq: Every day | ORAL | Status: DC
  Administered 2012-01-02 – 2012-01-04 (×3): 0.5 mg via ORAL
  Filled 2012-01-02 (×3): qty 1

## 2012-01-02 MED ORDER — POTASSIUM CHLORIDE CRYS ER 20 MEQ PO TBCR
40.0000 meq | EXTENDED_RELEASE_TABLET | Freq: Once | ORAL | Status: AC
  Administered 2012-01-02: 40 meq via ORAL
  Filled 2012-01-02 (×2): qty 1

## 2012-01-02 MED ORDER — MORPHINE SULFATE 2 MG/ML IJ SOLN
2.0000 mg | INTRAMUSCULAR | Status: DC | PRN
  Administered 2012-01-02 – 2012-01-03 (×4): 2 mg via INTRAVENOUS
  Administered 2012-01-03: 4 mg via INTRAVENOUS
  Administered 2012-01-03: 2 mg via INTRAVENOUS
  Filled 2012-01-02: qty 1
  Filled 2012-01-02: qty 2
  Filled 2012-01-02 (×4): qty 1

## 2012-01-02 NOTE — Progress Notes (Signed)
Subjective: Patient is awake without any complains.  He states that he feels fine.   Objective: Weight change:   Intake/Output Summary (Last 24 hours) at 01/02/12 0849 Last data filed at 01/02/12 0848  Gross per 24 hour  Intake   1930 ml  Output   1820 ml  Net    110 ml    Filed Vitals:   01/02/12 0800  BP: 153/93  Pulse: 81  Temp:   Resp: 25   Gen:  Patient awake and appears his stated age. CV: RRR Lungs CTA bilaterally ABD: pos BS nontender EXT: no edema  Lab Results: Reviewed  Micro Results: Recent Results (from the past 240 hour(s))  MRSA PCR SCREENING     Status: Normal   Collection Time   01/01/12  6:12 PM      Component Value Range Status Comment   MRSA by PCR NEGATIVE  NEGATIVE  Final     Studies/Results: Dg Chest Port 1 View  01/01/2012  *RADIOLOGY REPORT*  Clinical Data: Chest pain, cough  PORTABLE CHEST - 1 VIEW  Comparison: 07/18/2010  Findings: Left basilar opacity, atelectasis versus pneumonia. Increased interstitial markings without frank interstitial edema. No pleural effusion or pneumothorax.  The heart is top normal in size.  IMPRESSION: Left basilar opacity, atelectasis versus pneumonia.  Original Report Authenticated By: Charline Bills, M.D.   Medications: Scheduled Meds:   . sodium chloride   Intravenous STAT  . albuterol  2.5 mg Nebulization Q6H  . budesonide-formoterol  2 puff Inhalation BID  . enoxaparin  40 mg Subcutaneous Q24H  . ipratropium  0.5 mg Nebulization Q6H  . levETIRAcetam  1,000 mg Oral Daily  . levofloxacin (LEVAQUIN) IV  750 mg Intravenous Q24H  . metoprolol succinate  50 mg Oral Daily   Continuous Infusions:   . dextrose 5 % and 0.45% NaCl 100 mL/hr at 01/02/12 0547  . DISCONTD: sodium chloride Stopped (01/01/12 1932)   PRN Meds:.acetaminophen, albuterol, metoprolol, ondansetron (ZOFRAN) IV, ondansetron  Assessment/Plan: Benzodiazepine overdose (01/01/2012) Psych consult pending.  He is on chronic Benzo, to  prevent withdrawal will give low dose xanax when awake and alert daily.  Anxiety (01/02/2012)/Depression (01/02/2012) On low dose xanax  HTN (hypertension) (01/02/2012) Continue Toprol  Pneumonia (01/02/2012) On Levaquin  Hypokalemia (01/02/2012) Replete  Back Pain Get X_Ray of the back,  This problem has been going on for quite some time per pt.  Disp: Transfer to Tele    LOS: 1 day   [name] 01/02/2012, 8:49 AM
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
## 2012-01-02 NOTE — Progress Notes (Signed)
Spoke with Pt's father who was not in a place where he felt comfortable speaking with CSW.  Father to call CSW in the morning to discuss Pt.  CSW to continue to follow.

## 2012-01-02 NOTE — Progress Notes (Signed)
Received pt from icu with sitter at side.  Safety eval of room complete.  Pt orient to room call bell at side.
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
## 2012-01-03 DIAGNOSIS — F411 Generalized anxiety disorder: Secondary | ICD-10-CM

## 2012-01-03 DIAGNOSIS — F17200 Nicotine dependence, unspecified, uncomplicated: Secondary | ICD-10-CM

## 2012-01-03 LAB — CBC
MCHC: 33.5 g/dL (ref 30.0–36.0)
MCV: 82.2 fL (ref 78.0–100.0)
Platelets: 315 10*3/uL (ref 150–400)
RDW: 13.4 % (ref 11.5–15.5)
WBC: 11 10*3/uL — ABNORMAL HIGH (ref 4.0–10.5)

## 2012-01-03 LAB — BASIC METABOLIC PANEL
BUN: 7 mg/dL (ref 6–23)
Calcium: 8.7 mg/dL (ref 8.4–10.5)
Creatinine, Ser: 0.87 mg/dL (ref 0.50–1.35)
GFR calc Af Amer: >90 mL/min (ref >90)

## 2012-01-03 MED ORDER — SODIUM CHLORIDE 0.9 % IV SOLN: 250.0000 mL | INTRAVENOUS | Status: DC | PRN

## 2012-01-03 MED ORDER — LEVOFLOXACIN 750 MG PO TABS
750.0000 mg | ORAL_TABLET | Freq: Every day | ORAL | Status: DC
  Administered 2012-01-03 – 2012-01-04 (×2): 750 mg via ORAL
  Filled 2012-01-03 (×3): qty 1

## 2012-01-03 MED ORDER — SODIUM CHLORIDE 0.9 % IJ SOLN
3.0000 mL | Freq: Two times a day (BID) | INTRAMUSCULAR | Status: DC
  Administered 2012-01-03 – 2012-01-04 (×2): 3 mL via INTRAVENOUS

## 2012-01-03 MED ORDER — HYDROCODONE-ACETAMINOPHEN 5-325 MG PO TABS
1.0000 | ORAL_TABLET | ORAL | Status: DC | PRN
  Administered 2012-01-03 – 2012-01-04 (×5): 1 via ORAL
  Filled 2012-01-03 (×5): qty 1

## 2012-01-03 MED ORDER — SODIUM CHLORIDE 0.9 % IJ SOLN: 3.0000 mL | INTRAMUSCULAR | Status: DC | PRN

## 2012-01-03 MED ORDER — IPRATROPIUM BROMIDE 0.02 % IN SOLN
0.5000 mg | Freq: Three times a day (TID) | RESPIRATORY_TRACT | Status: DC
  Administered 2012-01-03 – 2012-01-04 (×3): 0.5 mg via RESPIRATORY_TRACT
  Filled 2012-01-03 (×5): qty 2.5

## 2012-01-03 MED ORDER — INFLUENZA VIRUS VACC SPLIT PF IM SUSP
0.5000 mL | Freq: Once | INTRAMUSCULAR | Status: AC
  Administered 2012-01-03: 0.5 mL via INTRAMUSCULAR
  Filled 2012-01-03: qty 0.5

## 2012-01-03 MED ORDER — ALBUTEROL SULFATE (5 MG/ML) 0.5% IN NEBU
2.5000 mg | INHALATION_SOLUTION | Freq: Three times a day (TID) | RESPIRATORY_TRACT | Status: DC
  Administered 2012-01-03 – 2012-01-04 (×3): 2.5 mg via RESPIRATORY_TRACT
  Filled 2012-01-03 (×5): qty 0.5
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
## 2012-01-03 NOTE — Progress Notes (Signed)
Subjective: Patient is awake and complains of back pain today. He fell in the past.   Objective: Weight change:   Intake/Output Summary (Last 24 hours) at 01/03/12 1534 Last data filed at 01/03/12 1236  Gross per 24 hour  Intake   2835 ml  Output    300 ml  Net   2535 ml    Filed Vitals:   01/03/12 1440  BP: 148/92  Pulse: 94  Temp: 97.8 F (36.6 C)  Resp: 22   Gen:  Patient awake and appears his stated age. CV: RRR Lungs CTA bilaterally ABD: pos BS nontender EXT: no edema  Lab Results: Reviewed  Micro Results: Recent Results (from the past 240 hour(s))  MRSA PCR SCREENING     Status: Normal   Collection Time   01/01/12  6:12 PM      Component Value Range Status Comment   MRSA by PCR NEGATIVE  NEGATIVE  Final     Studies/Results: Dg Chest Port 1 View  01/01/2012  *RADIOLOGY REPORT*  Clinical Data: Chest pain, cough  PORTABLE CHEST - 1 VIEW  Comparison: 07/18/2010  Findings: Left basilar opacity, atelectasis versus pneumonia. Increased interstitial markings without frank interstitial edema. No pleural effusion or pneumothorax.  The heart is top normal in size.  IMPRESSION: Left basilar opacity, atelectasis versus pneumonia.  Original Report Authenticated By: Charline Bills, M.D.   Medications: Scheduled Meds:    . albuterol  2.5 mg Nebulization TID  . ALPRAZolam  0.5 mg Oral Daily  . budesonide-formoterol  2 puff Inhalation BID  . enoxaparin  40 mg Subcutaneous Q24H  . ipratropium  0.5 mg Nebulization TID  . levETIRAcetam  1,000 mg Oral Daily  . levofloxacin  750 mg Oral Daily  . metoprolol succinate  50 mg Oral Daily  . sodium chloride  3 mL Intravenous Q12H  . DISCONTD: albuterol  2.5 mg Nebulization Q6H  . DISCONTD: ipratropium  0.5 mg Nebulization Q6H  . DISCONTD: levofloxacin (LEVAQUIN) IV  750 mg Intravenous Q24H   Continuous Infusions:    . DISCONTD: dextrose 5 % and 0.45% NaCl 75 mL/hr at 01/02/12 1028   PRN Meds:.sodium chloride,  acetaminophen, albuterol, HYDROcodone-acetaminophen, metoprolol, ondansetron (ZOFRAN) IV, ondansetron, sodium chloride, DISCONTD:  morphine injection  Assessment/Plan: Benzodiazepine overdose (01/01/2012) Psych consult recommend inpatient treatment.  He is on chronic Benzo, to prevent withdrawal will continue low dose xanax when awake and alert daily.  Anxiety (01/02/2012)/Depression (01/02/2012) On low dose xanax  HTN (hypertension) (01/02/2012) Continue Toprol  Pneumonia (01/02/2012) On Levaquin changed to by mouth  Hypokalemia (01/02/2012) Resolve  Back Pain X-ray shows compression fractures. When necessary Vicodin  Disp: To [facility] when bed available.    LOS: 2 days   Lanny Lipkin JARRETT 01/03/2012, 3:34 PM

## 2012-01-03 NOTE — Consult Note (Signed)
Reason for Consult:Suicidl Ideation, Depression  Referring Physician:Dr. Jaedyn Hill is an 41 y.o. male.  HPI: Pt admitted for overdose of Xanax; denies suicidal ideation  AXIS  I  Major Depression, Benzodiazepine Dependence, overdose AXIS  II  Depression AXIS III Past Medical History  Diagnosis Date  . Anxiety   . Hypertension   . Seizure   . Suicidal behavior     History reviewed. No pertinent past surgical history. AXIS IV  Parenting issues, addiction AXIS V  GAF 40   Family History  Problem Relation Age of Onset  . Hypotension Mother   . Diabetes Mother     Social History:  reports that he has been smoking Cigarettes.  He has a 1 pack-year smoking history. He does not have any smokeless tobacco history on file. He reports that he drinks alcohol. He reports that he uses illicit drugs (Benzodiazepines).  Allergies: No Known Allergies  Medications:I have reviewed patient's current medication  Results for orders placed during the hospital encounter of 01/01/12 (from the past 48 hour(s))  ACETAMINOPHEN LEVEL     Status: Normal   Collection Time   01/01/12 12:40 PM      Component Value Range Comment   Acetaminophen (Tylenol), Serum <15.0  10 - 30 (ug/mL)   SALICYLATE LEVEL     Status: Abnormal   Collection Time   01/01/12 12:40 PM      Component Value Range Comment   Salicylate Lvl <2.0 (*) 2.8 - 20.0 (mg/dL)   COMPREHENSIVE METABOLIC PANEL     Status: Abnormal   Collection Time   01/01/12 12:40 PM      Component Value Range Comment   Sodium 143  135 - 145 (mEq/L)    Potassium 4.0  3.5 - 5.1 (mEq/L)    Chloride 108  96 - 112 (mEq/L)    CO2 25  19 - 32 (mEq/L)    Glucose, Bld 94  70 - 99 (mg/dL)    BUN 14  6 - 23 (mg/dL)    Creatinine, Ser 4.09  0.50 - 1.35 (mg/dL)    Calcium 9.7  8.4 - 10.5 (mg/dL)    Total Protein 7.7  6.0 - 8.3 (g/dL)    Albumin 4.1  3.5 - 5.2 (g/dL)    AST 27  0 - 37 (U/L) SLIGHT HEMOLYSIS   ALT 45  0 - 53 (U/L)    Alkaline  Phosphatase 87  39 - 117 (U/L)    Total Bilirubin 0.1 (*) 0.3 - 1.2 (mg/dL)    GFR calc non Af Amer >90  >90 (mL/min)    GFR calc Af Amer >90  >90 (mL/min)   ETHANOL     Status: Normal   Collection Time   01/01/12 12:40 PM      Component Value Range Comment   Alcohol, Ethyl (B) <11  0 - 11 (mg/dL)   CBC     Status: Abnormal   Collection Time   01/01/12 12:40 PM      Component Value Range Comment   WBC 12.0 (*) 4.0 - 10.5 (K/uL)    RBC 4.84  4.22 - 5.81 (MIL/uL)    Hemoglobin 13.5  13.0 - 17.0 (g/dL)    HCT 81.1  91.4 - 78.2 (%)    MCV 83.9  78.0 - 100.0 (fL)    MCH 27.9  26.0 - 34.0 (pg)    MCHC 33.3  30.0 - 36.0 (g/dL)    RDW 95.6  21.3 - 08.6 (%)  Platelets 388  150 - 400 (K/uL)   DIFFERENTIAL     Status: Abnormal   Collection Time   01/01/12 12:40 PM      Component Value Range Comment   Neutrophils Relative 71  43 - 77 (%)    Neutro Abs 8.6 (*) 1.7 - 7.7 (K/uL)    Lymphocytes Relative 19  12 - 46 (%)    Lymphs Abs 2.3  0.7 - 4.0 (K/uL)    Monocytes Relative 6  3 - 12 (%)    Monocytes Absolute 0.7  0.1 - 1.0 (K/uL)    Eosinophils Relative 3  0 - 5 (%)    Eosinophils Absolute 0.4  0.0 - 0.7 (K/uL)    Basophils Relative 1  0 - 1 (%)    Basophils Absolute 0.1  0.0 - 0.1 (K/uL)   URINALYSIS, ROUTINE W REFLEX MICROSCOPIC     Status: Normal   Collection Time   01/01/12 12:44 PM      Component Value Range Comment   Color, Urine YELLOW  YELLOW     APPearance CLEAR  CLEAR     Specific Gravity, Urine 1.027  1.005 - 1.030     pH 6.0  5.0 - 8.0     Glucose, UA NEGATIVE  NEGATIVE (mg/dL)    Hgb urine dipstick NEGATIVE  NEGATIVE     Bilirubin Urine NEGATIVE  NEGATIVE     Ketones, ur NEGATIVE  NEGATIVE (mg/dL)    Protein, ur NEGATIVE  NEGATIVE (mg/dL)    Urobilinogen, UA 0.2  0.0 - 1.0 (mg/dL)    Nitrite NEGATIVE  NEGATIVE     Leukocytes, UA NEGATIVE  NEGATIVE  MICROSCOPIC NOT DONE ON URINES WITH NEGATIVE PROTEIN, BLOOD, LEUKOCYTES, NITRITE, OR GLUCOSE <1000 mg/dL.  URINE RAPID  DRUG SCREEN (HOSP PERFORMED)     Status: Abnormal   Collection Time   01/01/12 12:45 PM      Component Value Range Comment   Opiates NONE DETECTED  NONE DETECTED     Cocaine NONE DETECTED  NONE DETECTED     Benzodiazepines POSITIVE (*) NONE DETECTED     Amphetamines NONE DETECTED  NONE DETECTED     Tetrahydrocannabinol NONE DETECTED  NONE DETECTED     Barbiturates NONE DETECTED  NONE DETECTED    MRSA PCR SCREENING     Status: Normal   Collection Time   01/01/12  6:12 PM      Component Value Range Comment   MRSA by PCR NEGATIVE  NEGATIVE    GLUCOSE, CAPILLARY     Status: Abnormal   Collection Time   01/01/12  7:57 PM      Component Value Range Comment   Glucose-Capillary 101 (*) 70 - 99 (mg/dL)    Comment 1 Documented in Chart      Comment 2 Notify RN     GLUCOSE, CAPILLARY     Status: Abnormal   Collection Time   01/01/12 11:55 PM      Component Value Range Comment   Glucose-Capillary 116 (*) 70 - 99 (mg/dL)    Comment 1 Documented in Chart      Comment 2 Notify RN     COMPREHENSIVE METABOLIC PANEL     Status: Abnormal   Collection Time   01/02/12  3:14 AM      Component Value Range Comment   Sodium 141  135 - 145 (mEq/L)    Potassium 3.1 (*) 3.5 - 5.1 (mEq/L)    Chloride 107  96 -  112 (mEq/L)    CO2 22  19 - 32 (mEq/L)    Glucose, Bld 121 (*) 70 - 99 (mg/dL)    BUN 10  6 - 23 (mg/dL)    Creatinine, Ser 1.61  0.50 - 1.35 (mg/dL)    Calcium 8.7  8.4 - 10.5 (mg/dL)    Total Protein 6.3  6.0 - 8.3 (g/dL)    Albumin 3.3 (*) 3.5 - 5.2 (g/dL)    AST 19  0 - 37 (U/L)    ALT 35  0 - 53 (U/L)    Alkaline Phosphatase 71  39 - 117 (U/L)    Total Bilirubin 0.1 (*) 0.3 - 1.2 (mg/dL)    GFR calc non Af Amer >90  >90 (mL/min)    GFR calc Af Amer >90  >90 (mL/min)   CBC     Status: Abnormal   Collection Time   01/02/12  3:14 AM      Component Value Range Comment   WBC 12.2 (*) 4.0 - 10.5 (K/uL)    RBC 3.98 (*) 4.22 - 5.81 (MIL/uL)    Hemoglobin 11.1 (*) 13.0 - 17.0 (g/dL)    HCT  09.6 (*) 04.5 - 52.0 (%)    MCV 82.7  78.0 - 100.0 (fL)    MCH 27.9  26.0 - 34.0 (pg)    MCHC 33.7  30.0 - 36.0 (g/dL)    RDW 40.9  81.1 - 91.4 (%)    Platelets 339  150 - 400 (K/uL)   GLUCOSE, CAPILLARY     Status: Abnormal   Collection Time   01/02/12  3:53 AM      Component Value Range Comment   Glucose-Capillary 126 (*) 70 - 99 (mg/dL)    Comment 1 Documented in Chart      Comment 2 Notify RN     GLUCOSE, CAPILLARY     Status: Abnormal   Collection Time   01/02/12  8:41 AM      Component Value Range Comment   Glucose-Capillary 144 (*) 70 - 99 (mg/dL)    Comment 1 Documented in Chart      Comment 2 Notify RN     GLUCOSE, CAPILLARY     Status: Abnormal   Collection Time   01/02/12 12:45 PM      Component Value Range Comment   Glucose-Capillary 125 (*) 70 - 99 (mg/dL)    Comment 1 Documented in Chart      Comment 2 Notify RN       Dg Thoracic Spine 2 View  01/02/2012  *RADIOLOGY REPORT*  Clinical Data: Upper back pain.  THORACIC SPINE - 2 VIEW  Comparison: No priors.  Findings: AP and lateral views of the thoracic spine demonstrate no acute fracture or gross malalignment.  The lateral projection does demonstrate a compression fracture of the superior endplate of an upper thoracic vertebral body (likely T4), unchanged compared 07/18/2010 (lateral chest radiograph).  As well, there is a compression fracture of L1 with approximately 50% loss of anterior vertebral body height (also unchanged).  IMPRESSION:  1.  No definite acute radiographic abnormality of the thoracic spine. 2.  However, there are old compression fractures at T4 and L1, as detailed above, which appear similar to prior lateral chest radiograph 07/18/2010.  Original Report Authenticated By: Florencia Reasons, M.D.   Dg Chest Port 1 View  01/01/2012  *RADIOLOGY REPORT*  Clinical Data: Chest pain, cough  PORTABLE CHEST - 1 VIEW  Comparison: 07/18/2010  Findings: Left  basilar opacity, atelectasis versus pneumonia. Increased  interstitial markings without frank interstitial edema. No pleural effusion or pneumothorax.  The heart is top normal in size.  IMPRESSION: Left basilar opacity, atelectasis versus pneumonia.  Original Report Authenticated By: Charline Bills, M.D.    Review of Systems  Unable to perform ROS: other   Blood pressure 157/99, pulse 76, temperature 98.7 F (37.1 C), temperature source Oral, resp. rate 20, height 5\' 9"  (1.753 m), weight 93.8 kg (206 lb 12.7 oz), SpO2 97.00%. Physical Exam  Assessment/Plan:  Chart reviewed,  Pt is seen about 5:15 pm  01/02/12    Discussed with Head Nurse,  father [name] and mother [name]; collateral information invaluable.  Benzodiazepine ordered by  Dean Keel MSN,APR, N  PC [phone] Pt is awake and alert.  He has spontaneous speech and says he does not know why he took 10 Xanax; just kept going to the bottle w/o realizing it.  He denies SI, AH VH He names friends and parents.  He fails to mention ex-wife; her ultimatum that he has to stop abusing Xanax in order to see the children.  His parents arrive at the end of this interview to provide information [told earlier but omits at this time].  He has been abusing his Xanax as soon as he gets the prescription filled. His parents are exasperated.   Today he is complaining of flank pain.  XRay of kidneys and spine - neg except for old vertebral deterioration; no new changes  He is asking for pain medication repeatedly. Insight and judgment are poor.  Attempted to contact MD. RECOMMENDATION: 1. Pt is med seeking; control & minimize pain medication; avoid morphine  2. Caution: watch for Xanax withdrawal seizures 3. Pt needs to be referred to appropriate inpatient psychiatric unit  Consider IVC if necessary 4. Will follow pt.   [name] 01/03/2012, 2:53 AM
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
## 2012-01-03 NOTE — Progress Notes (Signed)
Met with Pt and psych MD.  Pt reports that he hasn't had a chance to read over the information that CSW provided him.  He stated that he's still unsure as to whether he wants tx.  Pt states repeatedly that he finished with the Xanax and pain pills and states that he wants to focus on being a father.  Psych MD encouraged Pt to peruse the information left by CSW.  Psych MD and CSW to meet with Pt tomorrow.  Providence Crosby, LCSWA Clinical Social Work [phone]

## 2012-01-04 ENCOUNTER — Inpatient Hospital Stay (HOSPITAL_COMMUNITY): Payer: Medicare Other

## 2012-01-04 ENCOUNTER — Encounter (HOSPITAL_COMMUNITY): Payer: Self-pay | Admitting: Radiology

## 2012-01-04 DIAGNOSIS — R0781 Pleurodynia: Secondary | ICD-10-CM | POA: Diagnosis present

## 2012-01-04 LAB — BASIC METABOLIC PANEL
Calcium: 9 mg/dL (ref 8.4–10.5)
GFR calc Af Amer: >90 mL/min (ref >90)
GFR calc non Af Amer: >90 mL/min (ref >90)
Glucose, Bld: 91 mg/dL (ref 70–99)
Potassium: 3.6 mEq/L (ref 3.5–5.1)
Sodium: 137 mEq/L (ref 135–145)

## 2012-01-04 LAB — CBC
Hemoglobin: 12.2 g/dL — ABNORMAL LOW (ref 13.0–17.0)
MCH: 27.3 pg (ref 26.0–34.0)
MCHC: 33.4 g/dL (ref 30.0–36.0)
RDW: 13.3 % (ref 11.5–15.5)

## 2012-01-04 MED ORDER — METHOCARBAMOL 500 MG PO TABS: 500.0000 mg | ORAL_TABLET | Freq: Three times a day (TID) | ORAL | Status: DC | PRN

## 2012-01-04 MED ORDER — HYDROCODONE-ACETAMINOPHEN 5-325 MG PO TABS: 1.0000 | ORAL_TABLET | ORAL | Status: AC | PRN

## 2012-01-04 MED ORDER — METHOCARBAMOL 500 MG PO TABS: 500.0000 mg | ORAL_TABLET | Freq: Two times a day (BID) | ORAL | Status: AC | PRN

## 2012-01-04 MED ORDER — LEVOFLOXACIN 750 MG PO TABS: 750.0000 mg | ORAL_TABLET | Freq: Every day | ORAL | Status: AC

## 2012-01-04 MED ORDER — IOHEXOL 300 MG/ML  SOLN
100.0000 mL | Freq: Once | INTRAMUSCULAR | Status: AC | PRN
  Administered 2012-01-04: 100 mL via INTRAVENOUS

## 2012-01-04 NOTE — Progress Notes (Signed)
Message from Colen Darling, Pt's outpt therapist.    Mrs. Earnest Conroy stating that she had a cancellation for next Wed, Feb 20 at 900.  She asked me to call her back to confirm this appt day and time.  LM for Mrs. Earnest Conroy asking her to officially schedule Pt for Wed, Feb 20 at 900.  Notified Pt of appt day and time.  Providence Crosby, LCSWA Clinical Social Work [phone]

## 2012-01-04 NOTE — Progress Notes (Signed)
Spoke with Pt re: d/c plans.  Pt feels that he is Ok to d/c home, as he intends to resume NA meetings.  Pt stated that he still doesn't understand why he took the excess of Xanax, as he had just received his 90-day pin from NA.  Pt agreeable to continued outpt tx with Velta Addison and Colen Darling.  Notified MD.  Per MD, Pt will most likely d/c today.  Made an appt for Pt with Carolynn Sayers, NP, for tomorrow, Thursday, at 1:15.  LM for Pt's therapist, Colen Darling.  Provided MD with Pt's appt information with Carolynn Sayers.  CSW to continue to follow.  Providence Crosby, LCSWA Clinical Social Work [phone]

## 2012-01-04 NOTE — Progress Notes (Signed)
T/c from Pt's father requesting to meet with CSW and Pt today at 3:30.  Providence Crosby, LCSWA Clinical Social Work [phone]

## 2012-01-04 NOTE — Progress Notes (Signed)
Patient discharged home with father, alert and oriented, discharge instructions given patient verbalize understanding of discharge instructions, patient in stable condition at this time

## 2012-01-04 NOTE — Discharge Summary (Signed)
DISCHARGE SUMMARY  CLAYBORNE DIVIS  MR#: [id number]  DOB:July 04, 1971  Date of Admission: 01/01/2012 Date of Discharge: 01/04/2012  Attending Physician:Thresea Doble JARRETT  Patient's [name]:WJXBJYNW,GNFAOZ M, MD, MD  Consults:  Psychiatry  Discharge Diagnoses: .Benzodiazepine overdose .Anxiety .Depression .HTN (hypertension) .Pneumonia .Hypokalemia .Leukocytosis .Chest pain, pleuritic   Rib Fractures    Medication List  As of 01/04/2012  1:21 PM   STOP taking these medications         ALPRAZolam 0.5 MG tablet         TAKE these medications         budesonide-formoterol 80-4.5 MCG/ACT inhaler   Commonly known as: SYMBICORT   Inhale 2 puffs into the lungs 2 (two) times daily.      HYDROcodone-acetaminophen 5-325 MG per tablet   Commonly known as: NORCO   Take 1 tablet by mouth every 4 (four) hours as needed.      levETIRAcetam 500 MG 24 hr tablet   Commonly known as: KEPPRA XR   Take 1,000 mg by mouth daily.      levofloxacin 750 MG tablet   Commonly known as: LEVAQUIN   Take 1 tablet (750 mg total) by mouth daily.      methocarbamol 500 MG tablet   Commonly known as: ROBAXIN   Take 1 tablet (500 mg total) by mouth 2 (two) times daily as needed.      metoprolol succinate 50 MG 24 hr tablet   Commonly known as: TOPROL-XL   Take 50 mg by mouth daily. Take with or immediately following a meal.      risperiDONE 1 MG tablet   Commonly known as: RISPERDAL   Take 1 mg by mouth 2 (two) times daily.           HPI: 41 y/o man with history of anxiety disorder on Xanax ,was brought in to the ER today because of somnolencce, slurring of speech and unsteady gait. As per his parents patient took approximately 90 tablets of Xanax over the last a couple of days.he was noticed to be depressed and as per his mother he is abusing Xanax and buy it from the streets. Patient denies any suicidal ideation or attempts, however his mother stated that this is his third attempt.    Patient denies shortness of breath, however stated that he have occasional cough productive of yellowish sputum.  in the ER patient was saturating 97% on room air, blood pressure was elevated at 151/102 and he was noted to the lower speech . Patient was given IV fluids and will Were asked to admit for further management  Allergies:   Hospital Course: .Benzodiazepine overdose Patient was admitted to the hospital after he took 90 pills of Xanax. Patient states that he cannot even remember doing it.  He was initially admitted to the Intensive Care Unit he had a sitter and was observed  under suicide precaution until he became awake alert and oriented. He was evaluated by Psychiatry. Initially patient  was to be discharged to [facility]. Patient denied any further suicidal ideation and refused to go to [facility]. He was reevaluated by psychiatry and cleared for discharge with outpatient followup. Patient has followup appointment for the morning with psychiatry.   .Anxiety Patient was on Xanax outpatient. While he was inpatient he was treated with Xanax 0.5 XR. to prevent withdrawal. He had no symptoms or signs of withdrawal during his admission. He will not be discharged on Xanax. He received one dose the day  prior to discharge and he has an appointment to follow up with Psychiatry in the morning.   .Depression Will be addressed when he follows up with Psychiatry. He is on Risperidone and Depakote inpatient.  Marland KitchenHTN (hypertension) Patient was continued on Metoprolol inpatient and will be discharged on Metoprolol. Blood pressure is slightly elevated. He will follow up with his primary care doctor in one week to readdress blood pressure.   .Pneumonia Chest x-ray showed questionable infiltrate. Patient was discharged to complete a course of Levaquin for presumed pneumonia.   .Hypokalemia Potassium was repleted while he was inpatient.   .Leukocytosis White count was elevated. This  could be combination of  Pneumonia versus demargination from stress. As mentioned above he was treated with Levaquin and discharged on Levaquin   .Chest pain, pleuritic secondary to rib fractures: Patient had some chest pain while he was inpatient. He had a CT angiography chest to rule out PE. CT scan did not show PE but did show that he had several rib fractures that were nondisplaced. Patient will follow up with PCP for further management of his pain. He was given prescription for Norco.  Day of Discharge BP 148/95  Pulse 83  Temp(Src) 98.5 F (36.9 C) (Oral)  Resp 20  Ht 5\' 9"  (1.753 m)  Wt 93.8 kg (206 lb 12.7 oz)  BMI 30.54 kg/m2  SpO2 93%  Physical Exam: Gen: Patient awake and appears his stated age.  CV: RRR  Lungs CTA bilaterally, tender over the rib area  ABD: pos BS nontender  EXT: no edema   Results for orders placed during the hospital encounter of 01/01/12 (from the past 24 hour(s))  CBC     Status: Abnormal   Collection Time   01/04/12  4:49 AM      Component Value Range   WBC 11.0 (*) 4.0 - 10.5 (K/uL)   RBC 4.47  4.22 - 5.81 (MIL/uL)   Hemoglobin 12.2 (*) 13.0 - 17.0 (g/dL)   HCT 16.1 (*) 09.6 - 52.0 (%)   MCV 81.7  78.0 - 100.0 (fL)   MCH 27.3  26.0 - 34.0 (pg)   MCHC 33.4  30.0 - 36.0 (g/dL)   RDW 04.5  40.9 - 81.1 (%)   Platelets 342  150 - 400 (K/uL)  BASIC METABOLIC PANEL     Status: Normal   Collection Time   01/04/12  4:49 AM      Component Value Range   Sodium 137  135 - 145 (mEq/L)   Potassium 3.6  3.5 - 5.1 (mEq/L)   Chloride 101  96 - 112 (mEq/L)   CO2 25  19 - 32 (mEq/L)   Glucose, Bld 91  70 - 99 (mg/dL)   BUN 11  6 - 23 (mg/dL)   Creatinine, Ser 9.14  0.50 - 1.35 (mg/dL)   Calcium 9.0  8.4 - 78.2 (mg/dL)   GFR calc non Af Amer >90  >90 (mL/min)   GFR calc Af Amer >90  >90 (mL/min)    Disposition: Home  Follow-up Appts:   Follow-up Information    Follow up with Jearld Lesch, MD in 1 week.      Follow up with Annabell Sabal., NP on 01/05/2012. (Tomorrow at 1:15 PM)    Contact information:   [address] [place] [address] [phone]          Time spent in discharge (includes decision making & examination of pt):  Signed: Laksh Hinners JARRETT 01/04/2012,  1:21 PM

## 2012-01-05 NOTE — Progress Notes (Signed)
Spoke with Pt and family.    Discussed with family psych MD's decision not to IVC Pt, as psych MD felt that Pt has the capacity to make decisions for himself.  Parents upset with this, as they feel that Pt continues to make the wrong decisions.  CSW explained to parents that, since Pt is an adult, Pt can continue to make the wrong decisions for himself.  Contacted [facility] on behalf of Pt and parents.  Learned that [facility]'t accept [facility].    Contacted [facility] Tx facility in [place], [place].  Learned that they have a very short waiting list.  Provided Pt and family with information on [facility] Tx facility.  Provided Pt with his outpt appt day and time with Colen Darling.  Encouraged Pt's parents to attend with Pt his appt tomorrow with [place] to discuss their concerns regarding the scripts that are being given to Pt.  Thanked Pt and his parents for their time.  Providence Crosby, LCSWA Clinical Social Work [phone]

## 2012-01-11 ENCOUNTER — Encounter (HOSPITAL_COMMUNITY): Payer: Self-pay

## 2012-03-18 ENCOUNTER — Emergency Department (HOSPITAL_COMMUNITY)
Admission: EM | Admit: 2012-03-18 | Discharge: 2012-03-19 | Disposition: A | Discharge status: Home / Self Care | Payer: Medicare Other | Attending: Emergency Medicine | Admitting: Emergency Medicine

## 2012-03-18 ENCOUNTER — Encounter (HOSPITAL_COMMUNITY): Payer: Self-pay

## 2012-03-18 DIAGNOSIS — F411 Generalized anxiety disorder: Secondary | ICD-10-CM | POA: Insufficient documentation

## 2012-03-18 DIAGNOSIS — I10 Essential (primary) hypertension: Secondary | ICD-10-CM | POA: Insufficient documentation

## 2012-03-18 DIAGNOSIS — J45909 Unspecified asthma, uncomplicated: Secondary | ICD-10-CM | POA: Insufficient documentation

## 2012-03-18 DIAGNOSIS — F32A Depression, unspecified: Secondary | ICD-10-CM

## 2012-03-18 DIAGNOSIS — Z79899 Other long term (current) drug therapy: Secondary | ICD-10-CM | POA: Insufficient documentation

## 2012-03-18 DIAGNOSIS — F329 Major depressive disorder, single episode, unspecified: Secondary | ICD-10-CM | POA: Insufficient documentation

## 2012-03-18 DIAGNOSIS — F3289 Other specified depressive episodes: Secondary | ICD-10-CM | POA: Insufficient documentation

## 2012-03-18 DIAGNOSIS — F1910 Other psychoactive substance abuse, uncomplicated: Secondary | ICD-10-CM | POA: Insufficient documentation

## 2012-03-18 NOTE — ED Notes (Signed)
Pt talking and alert.

## 2012-03-19 LAB — COMPREHENSIVE METABOLIC PANEL
ALT: 57 U/L — ABNORMAL HIGH (ref 0–53)
AST: 35 U/L (ref 0–37)
Alkaline Phosphatase: 101 U/L (ref 39–117)
Calcium: 8.7 mg/dL (ref 8.4–10.5)
Potassium: 3.9 mEq/L (ref 3.5–5.1)
Sodium: 134 mEq/L — ABNORMAL LOW (ref 135–145)
Total Protein: 7.2 g/dL (ref 6.0–8.3)

## 2012-03-19 LAB — RAPID URINE DRUG SCREEN, HOSP PERFORMED
Amphetamines: NOT DETECTED
Benzodiazepines: POSITIVE — AB
Cocaine: POSITIVE — AB
Opiates: POSITIVE — AB
Tetrahydrocannabinol: NOT DETECTED

## 2012-03-19 LAB — CBC
MCH: 27.4 pg (ref 26.0–34.0)
MCHC: 33.5 g/dL (ref 30.0–36.0)
Platelets: 326 10*3/uL (ref 150–400)
RBC: 5.03 MIL/uL (ref 4.22–5.81)

## 2012-03-19 NOTE — ED Notes (Signed)
Dad at bedside for d/c home.

## 2012-03-19 NOTE — Discharge Instructions (Signed)
Do not take medications that were not prescribed to you. Medications have different interactions, and without knowing what you are taking, you may have a bad reaction.  Please contact your therapist tomorrow and let them know you were in the ER.  Return to the ER for worsening condition  Depression  Depression is a strong emotion of feeling unhappy that can last for weeks, months, or even longer. Depression causes problems with the ability to function in life. It upsets your:   Relationships.   Sleep.   Eating habits.   Work habits.  HOME CARE  Take all medicine as told by your doctor.   Talk with a therapist, counselor, or friend.   Eat a healthy diet.   Exercise regularly.   Do not drink alcohol or use drugs.  GET HELP RIGHT AWAY IF: You start to have thoughts about hurting yourself or others. MAKE SURE YOU:  Understand these instructions.   Will watch your condition.   Will get help right away if you are not doing well or get worse.  Document Released: 12/10/2010 Document Revised: 10/27/2011 Document Reviewed: 12/10/2010 [facility] Patient Information 2012 [place], [place].  Polysubstance Abuse When people abuse more than one drug or type of drug it is called polysubstance or polydrug abuse. For example, many smokers also drink alcohol. This is one form of polydrug abuse. Polydrug abuse also refers to the use of a drug to counteract an unpleasant effect produced by another drug. It may also be used to help with withdrawal from another drug. People who take stimulants may become agitated. Sometimes this agitation is countered with a tranquilizer. This helps protect against the unpleasant side effects. Polydrug abuse also refers to the use of different drugs at the same time.  Anytime drug use is interfering with normal living activities, it has become abuse. This includes problems with family and friends. Psychological dependence has developed when your mind tells you that the  drug is needed. This is usually followed by physical dependence which has developed when continuing increases of drug are required to get the same feeling or "high". This is known as addiction or chemical dependency. A person's risk is much higher if there is a history of chemical dependency in the family. SIGNS OF CHEMICAL DEPENDENCY  You have been told by friends or family that drugs have become a problem.   You fight when using drugs.   You are having blackouts (not remembering what you do while using).   You feel sick from using drugs but continue using.   You lie about use or amounts of drugs (chemicals) used.   You need chemicals to get you going.   You are suffering in work performance or in school because of drug use.   You get sick from use of drugs but continue to use anyway.   You need drugs to relate to people or feel comfortable in social situations.   You use drugs to forget problems.  "Yes" answered to any of the above signs of chemical dependency indicates there are problems. The longer the use of drugs continues, the greater the problems will become. If there is a family history of drug or alcohol use, it is best not to experiment with these drugs. Continual use leads to tolerance. After tolerance develops more of the drug is needed to get the same feeling. This is followed by addiction. With addiction, drugs become the most important part of life. It becomes more important to take drugs than  participate in the other usual activities of life. This includes relating to friends and family. Addiction is followed by dependency. Dependency is a condition where drugs are now needed not just to get high, but to feel normal. Addiction cannot be cured but it can be stopped. This often requires outside help and the care of professionals. Treatment centers are listed in the yellow pages under: Cocaine, Narcotics, and Alcoholics Anonymous. Most hospitals and clinics can refer you to a  specialized care center. Talk to your caregiver if you need help. Document Released: 06/29/2005 Document Revised: 10/27/2011 Document Reviewed: 11/07/2005 [facility] Patient Information 2012 [place], [place]. Suicidal Feelings, How to Help Yourself Everyone feels sad or unhappy at times, but depressing thoughts and feelings of hopelessness can lead to thoughts of suicide. It can seem as if life is too tough to handle. It is as if the mountain is just too high and your climbing skills are not great enough. At that moment these dark thoughts and feelings may seem overwhelming and never ending. It is important to remember these feelings are temporary! They will go away. If you feel as though you have reached the point where suicide is the only answer, it is time to let someone know immediately. This is the first step to feeling better. The following steps will move you to safer ground and lead you in a positive direction out of depression. HOW TO COPE AND PREVENT SUICIDE  Let family, friends, teachers and/or counselors know. Get help. Try not to isolate yourself from those who care about you. Even though you may not feel sociable or think that you are not good company, talk with someone everyday. It is best if it is face to face. Remember, they will want to help you.   Eat a regularly spaced and well-balanced diet, and get plenty of rest.   Avoid alcohol and drugs because they will only make you feel worse and may also lower your inhibitions. Remove them from the home. If you are thinking of taking an overdose of your prescribed medications, give your medicines to someone who can give them to you one day at a time. If you are on antidepressants, let your caregiver know of your feelings so he or she can provide a safer medication, if that is a concern.   Remove weapons or poisons from your home.   Try to stick to routines. That may mean just walking the dog or feeding the cat. Follow a schedule and remind  yourself that you have to keep that schedule every day. Play with your pets. If it is possible, and you do not have a pet, get one. They give you a sense of well-being, lower your blood pressure and make your heart feel good. They need you, and we all want to be needed.   Set some realistic goals and achieve them. Make a list and cross things off as you go. Accomplishments give a sense of worth. Wait until you are feeling better before doing things you find difficult or unpleasant to do.   If you are able, try to start exercising. Even half-hour periods of exercise each day will make you feel better. Getting out in the sun or into nature helps you recover from depression faster. If you have a favorite place to walk, take advantage of that.   Increase safe activities that have always given you pleasure. This may include playing your favorite music, reading a good book, painting a picture or playing your favorite  instrument. Do whatever takes your mind off your depression and puts a smile on your face.   Keep your living space well lit with windows open, and let the sun shine in. Bright light definitely treats depression, not just people with the seasonal affective disorders (SAD).  Above all else remember, depression is temporary. It will go away. Do not contemplate suicide. Death as a permanent solution is not the answer. Suicide will take away the beautiful rest of life, and do lifelong harm to those around you who love you. Help is available. National Suicide Help Lines with 24 hour help are: 1-800-SUICIDE 779-019-5904 Document Released: 05/14/2003 Document Revised: 10/27/2011 Document Reviewed: 10/02/2007 [facility] Patient Information 2012 [place], [place].

## 2012-03-19 NOTE — ED Provider Notes (Signed)
History     CSN: [id number]  Arrival date & time 03/18/12  2307   First MD Initiated Contact with Patient 03/18/12 2340      Chief Complaint  Patient presents with  . Drug Overdose    (Consider location/radiation/quality/duration/timing/severity/associated sxs/prior treatment) HPI 41 year old male presents to emergency room with thoughts of suicide. Patient reports he's been very anxious today, yesterday he bought some Xanax from his friend and took that in addition to his Klonopin. Vision has history of today is pain abuse. Patient reports she took 4 Xanax of unknown strength along with 2 of his Klonopin. Patient then took his normal nighttime medications. He denies taking extras of any other medications. He denies taking these extra pills as a suicide attempt, he only took them because he has been more anxious. Patient does have history of suicide attempt by overdose about a month a half ago. Patient reports he's under a lot of stress due to divorce from his wife and child custody issues  Past Medical History  Diagnosis Date  . Anxiety   . Hypertension   . Seizure   . Suicidal behavior   . Asthma     History reviewed. No pertinent past surgical history.  Family History  Problem Relation Age of Onset  . Hypotension Mother   . Diabetes Mother     History  Substance Use Topics  . Smoking status: Current Everyday Smoker -- 1.0 packs/day for 1 years    Types: Cigarettes  . Smokeless tobacco: Not on file  . Alcohol Use: Yes      Review of Systems  All other systems reviewed and are negative.    Allergies  Tramadol  Home Medications   Current Outpatient Rx  Name Route Sig Dispense Refill  . AMLODIPINE BESYLATE 10 MG PO TABS Oral Take 10 mg by mouth daily.    . BUDESONIDE-FORMOTEROL FUMARATE 80-4.5 MCG/ACT IN AERO Inhalation Inhale 2 puffs into the lungs 2 (two) times daily.    Marland Kitchen CLONAZEPAM 0.5 MG PO TABS Oral Take 0.5 mg by mouth 4 (four) times daily.    Marland Kitchen  HYDROCODONE-ACETAMINOPHEN 10-325 MG PO TABS Oral Take 1 tablet by mouth 2 (two) times daily.    Marland Kitchen LEVETIRACETAM ER 500 MG PO TB24 Oral Take 1,000 mg by mouth daily.    Marland Kitchen RISPERIDONE 1 MG PO TABS Oral Take 1 mg by mouth 2 (two) times daily.      BP 120/86  Pulse 104  Resp 20  SpO2 98%  Physical Exam  Nursing note and vitals reviewed. Constitutional: He is oriented to person, place, and time. He appears well-developed and well-nourished.  HENT:  Head: Normocephalic and atraumatic.  Nose: Nose normal.  Mouth/Throat: Oropharynx is clear and moist.  Eyes: Conjunctivae and EOM are normal. Pupils are equal, round, and reactive to light.  Neck: Normal range of motion. Neck supple. No JVD present. No tracheal deviation present. No thyromegaly present.  Cardiovascular: Regular rhythm, normal heart sounds and intact distal pulses.  Exam reveals no gallop and no friction rub.   No murmur heard.      Mild tachycardia noted  Pulmonary/Chest: Effort normal and breath sounds normal. No stridor. No respiratory distress. He has no wheezes. He has no rales. He exhibits no tenderness.  Abdominal: Soft. Bowel sounds are normal. He exhibits no distension and no mass. There is no tenderness. There is no rebound and no guarding.  Musculoskeletal: Normal range of motion. He exhibits no edema and no tenderness.  Lymphadenopathy:    He has no cervical adenopathy.  Neurological: He is alert and oriented to person, place, and time. He has normal reflexes. No cranial nerve deficit. He exhibits normal muscle tone. Coordination normal.  Skin: Skin is dry. No rash noted. No erythema. No pallor.  Psychiatric: His behavior is normal. Thought content normal.       Patient with very flat affect, depressed demeanor. Patient with poor insight    ED Course  Procedures (including critical care time)   Labs Reviewed  CBC  COMPREHENSIVE METABOLIC PANEL  ETHANOL  ACETAMINOPHEN LEVEL  URINE RAPID DRUG SCREEN (HOSP  PERFORMED)  SALICYLATE LEVEL    Date: 03/19/2012  Rate: 111  Rhythm: sinus tachycardia  QRS Axis: normal  Intervals: normal  ST/T Wave abnormalities: nonspecific T wave changes  Conduction Disutrbances:first-degree A-V block   Narrative Interpretation:   Old EKG Reviewed: unchanged    1. Polysubstance abuse   2. Depression       MDM  41 year old male who presents due to depression and suicidal ideation. Patient has taken extra benzodiazepines but not in any intentional overdose. Patient is awake alert does not have any ill side effects from his overmedication. We'll medically clear patient and have the act team member see him 3:05 AM Patient noted to have polysubstance abuse. Patient has been seen back team member who feels that patient can contract for safety and is okay to go home. Patient does have good resources in the community with family connections and a therapist appointment within a week.  Olivia Mackie, MD 03/19/12 [phone]
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
## 2013-02-11 ENCOUNTER — Other Ambulatory Visit: Payer: Self-pay | Admitting: Physical Medicine and Rehabilitation

## 2013-02-11 DIAGNOSIS — IMO0002 Reserved for concepts with insufficient information to code with codable children: Secondary | ICD-10-CM

## 2013-02-21 ENCOUNTER — Other Ambulatory Visit: Payer: Medicare Other

## 2013-02-27 ENCOUNTER — Other Ambulatory Visit: Payer: Medicare Other

## 2013-02-28 ENCOUNTER — Other Ambulatory Visit: Payer: Medicare Other

## 2013-03-13 ENCOUNTER — Ambulatory Visit
Admission: RE | Admit: 2013-03-13 | Discharge: 2013-03-13 | Disposition: A | Discharge status: Home / Self Care | Payer: Medicare Other | Source: Ambulatory Visit | Attending: Physical Medicine and Rehabilitation | Admitting: Physical Medicine and Rehabilitation

## 2013-03-13 DIAGNOSIS — IMO0002 Reserved for concepts with insufficient information to code with codable children: Secondary | ICD-10-CM

## 2013-07-09 ENCOUNTER — Emergency Department (HOSPITAL_COMMUNITY)
Admission: EM | Admit: 2013-07-09 | Discharge: 2013-07-10 | Disposition: A | Discharge status: Other Acute Inpatient Hospital | Payer: Medicare Other | Attending: Emergency Medicine | Admitting: Emergency Medicine

## 2013-07-09 ENCOUNTER — Encounter (HOSPITAL_COMMUNITY): Payer: Self-pay | Admitting: Emergency Medicine

## 2013-07-09 ENCOUNTER — Emergency Department (HOSPITAL_COMMUNITY)
Admission: EM | Admit: 2013-07-09 | Discharge: 2013-07-09 | Disposition: A | Discharge status: Home / Self Care | Payer: Medicare Other | Source: Home / Self Care

## 2013-07-09 DIAGNOSIS — F411 Generalized anxiety disorder: Secondary | ICD-10-CM | POA: Insufficient documentation

## 2013-07-09 DIAGNOSIS — Z79899 Other long term (current) drug therapy: Secondary | ICD-10-CM | POA: Insufficient documentation

## 2013-07-09 DIAGNOSIS — F1910 Other psychoactive substance abuse, uncomplicated: Secondary | ICD-10-CM

## 2013-07-09 DIAGNOSIS — F339 Major depressive disorder, recurrent, unspecified: Secondary | ICD-10-CM | POA: Insufficient documentation

## 2013-07-09 DIAGNOSIS — I10 Essential (primary) hypertension: Secondary | ICD-10-CM | POA: Insufficient documentation

## 2013-07-09 DIAGNOSIS — F329 Major depressive disorder, single episode, unspecified: Secondary | ICD-10-CM

## 2013-07-09 DIAGNOSIS — F3289 Other specified depressive episodes: Secondary | ICD-10-CM | POA: Insufficient documentation

## 2013-07-09 DIAGNOSIS — J45909 Unspecified asthma, uncomplicated: Secondary | ICD-10-CM | POA: Insufficient documentation

## 2013-07-09 DIAGNOSIS — Z8659 Personal history of other mental and behavioral disorders: Secondary | ICD-10-CM | POA: Insufficient documentation

## 2013-07-09 DIAGNOSIS — F32A Depression, unspecified: Secondary | ICD-10-CM

## 2013-07-09 DIAGNOSIS — F1310 Sedative, hypnotic or anxiolytic abuse, uncomplicated: Secondary | ICD-10-CM | POA: Insufficient documentation

## 2013-07-09 DIAGNOSIS — G40909 Epilepsy, unspecified, not intractable, without status epilepticus: Secondary | ICD-10-CM | POA: Insufficient documentation

## 2013-07-09 DIAGNOSIS — G8929 Other chronic pain: Secondary | ICD-10-CM | POA: Insufficient documentation

## 2013-07-09 DIAGNOSIS — F419 Anxiety disorder, unspecified: Secondary | ICD-10-CM

## 2013-07-09 DIAGNOSIS — F17200 Nicotine dependence, unspecified, uncomplicated: Secondary | ICD-10-CM | POA: Insufficient documentation

## 2013-07-09 DIAGNOSIS — F1120 Opioid dependence, uncomplicated: Secondary | ICD-10-CM | POA: Insufficient documentation

## 2013-07-09 HISTORY — DX: Dorsalgia, unspecified: M54.9

## 2013-07-09 HISTORY — DX: Other chronic pain: G89.29

## 2013-07-09 LAB — CBC
Hemoglobin: 13.9 g/dL (ref 13.0–17.0)
RBC: 4.78 MIL/uL (ref 4.22–5.81)

## 2013-07-09 LAB — COMPREHENSIVE METABOLIC PANEL
ALT: 51 U/L (ref 0–53)
Alkaline Phosphatase: 81 U/L (ref 39–117)
CO2: 23 mEq/L (ref 19–32)
Chloride: 97 mEq/L (ref 96–112)
GFR calc Af Amer: >90 mL/min (ref >90)
GFR calc non Af Amer: >90 mL/min (ref >90)
Glucose, Bld: 99 mg/dL (ref 70–99)
Potassium: 4.1 mEq/L (ref 3.5–5.1)
Sodium: 133 mEq/L — ABNORMAL LOW (ref 135–145)

## 2013-07-09 MED ORDER — BUDESONIDE-FORMOTEROL FUMARATE 80-4.5 MCG/ACT IN AERO
2.0000 | INHALATION_SPRAY | Freq: Two times a day (BID) | RESPIRATORY_TRACT | Status: DC
  Administered 2013-07-09: 2 via RESPIRATORY_TRACT
  Filled 2013-07-09: qty 6.9

## 2013-07-09 MED ORDER — CARVEDILOL 12.5 MG PO TABS
12.5000 mg | ORAL_TABLET | Freq: Two times a day (BID) | ORAL | Status: DC
  Administered 2013-07-10 (×2): 12.5 mg via ORAL
  Filled 2013-07-09 (×3): qty 1

## 2013-07-09 MED ORDER — LORAZEPAM 1 MG PO TABS: 1.0000 mg | ORAL_TABLET | Freq: Three times a day (TID) | ORAL | Status: DC | PRN

## 2013-07-09 MED ORDER — ALUM & MAG HYDROXIDE-SIMETH 200-200-20 MG/5ML PO SUSP: 30.0000 mL | ORAL | Status: DC | PRN

## 2013-07-09 MED ORDER — RISPERIDONE 1 MG PO TABS
1.0000 mg | ORAL_TABLET | Freq: Two times a day (BID) | ORAL | Status: DC
  Administered 2013-07-09 – 2013-07-10 (×2): 1 mg via ORAL
  Filled 2013-07-09 (×2): qty 1

## 2013-07-09 MED ORDER — ONDANSETRON HCL 4 MG PO TABS: 4.0000 mg | ORAL_TABLET | Freq: Three times a day (TID) | ORAL | Status: DC | PRN

## 2013-07-09 MED ORDER — AMLODIPINE BESYLATE 10 MG PO TABS
10.0000 mg | ORAL_TABLET | Freq: Every day | ORAL | Status: DC
  Administered 2013-07-10: 10 mg via ORAL
  Filled 2013-07-09: qty 1

## 2013-07-09 MED ORDER — OXYCODONE-ACETAMINOPHEN 5-325 MG PO TABS
1.5000 | ORAL_TABLET | ORAL | Status: DC | PRN
  Administered 2013-07-09 – 2013-07-10 (×4): 1.5 via ORAL
  Filled 2013-07-09 (×4): qty 2

## 2013-07-09 MED ORDER — IBUPROFEN 200 MG PO TABS: 600.0000 mg | ORAL_TABLET | Freq: Three times a day (TID) | ORAL | Status: DC | PRN

## 2013-07-09 MED ORDER — LEVETIRACETAM ER 500 MG PO TB24
1000.0000 mg | ORAL_TABLET | Freq: Every day | ORAL | Status: DC
  Administered 2013-07-10: 1000 mg via ORAL
  Filled 2013-07-09: qty 2

## 2013-07-09 MED ORDER — CLONAZEPAM 0.5 MG PO TABS
0.5000 mg | ORAL_TABLET | Freq: Four times a day (QID) | ORAL | Status: DC
  Administered 2013-07-09 – 2013-07-10 (×3): 0.5 mg via ORAL
  Filled 2013-07-09 (×3): qty 1
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
## 2013-07-09 NOTE — ED Notes (Signed)
Pt was seen at a pain clinic this morning for chronic back pain.  The doctor there refused to give him any more pain medication because he said he was "loaded".  Sent him here for evaluation.  Pt appears to be under the influence, slurring his words, pin point pupils. Pt states that he took tylenol PM, (3 or 4) clonazepam, (2) percocet (10 mg).  Denies alcohol use.

## 2013-07-09 NOTE — ED Notes (Addendum)
Pt was seen earlier today for issues with his pain management clinic because pt went to clinic high.  Pt told provider that he took 3 or 4 xanax this morning.  Pt's father is now saying that the bottle of clonazepam was filled yesterday with 120 pills only has 14 left.  Pt left here and went to his father's house.  His father called some substance abuse resources, dropped pt off back at home.  Father went back to his house and found that pt had cut his wrists with a kitchen knife trying to kill himself.

## 2013-07-10 ENCOUNTER — Inpatient Hospital Stay (HOSPITAL_COMMUNITY): Admission: AD | Admit: 2013-07-10 | Payer: 59 | Source: Intra-hospital | Admitting: Psychiatry

## 2013-07-10 ENCOUNTER — Encounter (HOSPITAL_COMMUNITY): Payer: Self-pay | Admitting: Emergency Medicine

## 2013-07-10 ENCOUNTER — Inpatient Hospital Stay (HOSPITAL_COMMUNITY)
Admission: AD | Admit: 2013-07-10 | Discharge: 2013-07-12 | DRG: 897 | Disposition: A | Discharge status: Home / Self Care | Payer: No Typology Code available for payment source | Source: Intra-hospital | Attending: Psychiatry | Admitting: Psychiatry

## 2013-07-10 DIAGNOSIS — G8929 Other chronic pain: Secondary | ICD-10-CM | POA: Diagnosis present

## 2013-07-10 DIAGNOSIS — F32A Depression, unspecified: Secondary | ICD-10-CM

## 2013-07-10 DIAGNOSIS — F1120 Opioid dependence, uncomplicated: Principal | ICD-10-CM

## 2013-07-10 DIAGNOSIS — F1320 Sedative, hypnotic or anxiolytic dependence, uncomplicated: Secondary | ICD-10-CM

## 2013-07-10 DIAGNOSIS — F1310 Sedative, hypnotic or anxiolytic abuse, uncomplicated: Secondary | ICD-10-CM

## 2013-07-10 DIAGNOSIS — F1994 Other psychoactive substance use, unspecified with psychoactive substance-induced mood disorder: Secondary | ICD-10-CM

## 2013-07-10 DIAGNOSIS — F411 Generalized anxiety disorder: Secondary | ICD-10-CM | POA: Diagnosis present

## 2013-07-10 DIAGNOSIS — I10 Essential (primary) hypertension: Secondary | ICD-10-CM | POA: Diagnosis present

## 2013-07-10 DIAGNOSIS — F419 Anxiety disorder, unspecified: Secondary | ICD-10-CM | POA: Diagnosis present

## 2013-07-10 DIAGNOSIS — J45909 Unspecified asthma, uncomplicated: Secondary | ICD-10-CM | POA: Diagnosis present

## 2013-07-10 DIAGNOSIS — F329 Major depressive disorder, single episode, unspecified: Secondary | ICD-10-CM | POA: Diagnosis present

## 2013-07-10 DIAGNOSIS — Z79899 Other long term (current) drug therapy: Secondary | ICD-10-CM

## 2013-07-10 DIAGNOSIS — M549 Dorsalgia, unspecified: Secondary | ICD-10-CM | POA: Diagnosis present

## 2013-07-10 LAB — RAPID URINE DRUG SCREEN, HOSP PERFORMED
Barbiturates: NOT DETECTED
Benzodiazepines: POSITIVE — AB

## 2013-07-10 MED ORDER — ONDANSETRON 4 MG PO TBDP: 4.0000 mg | ORAL_TABLET | Freq: Four times a day (QID) | ORAL | Status: DC | PRN

## 2013-07-10 MED ORDER — HYDROXYZINE HCL 25 MG PO TABS
25.0000 mg | ORAL_TABLET | Freq: Four times a day (QID) | ORAL | Status: DC | PRN
  Filled 2013-07-10: qty 30

## 2013-07-10 MED ORDER — CLONIDINE HCL 0.1 MG PO TABS
0.1000 mg | ORAL_TABLET | Freq: Four times a day (QID) | ORAL | Status: DC
  Administered 2013-07-10 – 2013-07-12 (×7): 0.1 mg via ORAL
  Filled 2013-07-10 (×11): qty 1

## 2013-07-10 MED ORDER — METHOCARBAMOL 500 MG PO TABS: 500.0000 mg | ORAL_TABLET | Freq: Three times a day (TID) | ORAL | Status: DC | PRN

## 2013-07-10 MED ORDER — NAPROXEN 500 MG PO TABS: 500.0000 mg | ORAL_TABLET | Freq: Two times a day (BID) | ORAL | Status: DC | PRN

## 2013-07-10 MED ORDER — CARVEDILOL 25 MG PO TABS
12.5000 mg | ORAL_TABLET | Freq: Two times a day (BID) | ORAL | Status: DC
Start: 2013-07-11 — End: 2013-07-12
  Administered 2013-07-11 – 2013-07-12 (×3): 12.5 mg via ORAL
  Filled 2013-07-10 (×2): qty 1
  Filled 2013-07-10 (×2): qty 14
  Filled 2013-07-10 (×3): qty 1

## 2013-07-10 MED ORDER — RISPERIDONE 1 MG PO TABS
1.0000 mg | ORAL_TABLET | Freq: Two times a day (BID) | ORAL | Status: DC
  Administered 2013-07-10 – 2013-07-12 (×4): 1 mg via ORAL
  Filled 2013-07-10: qty 28
  Filled 2013-07-10 (×2): qty 1
  Filled 2013-07-10: qty 28
  Filled 2013-07-10 (×5): qty 1

## 2013-07-10 MED ORDER — AMLODIPINE BESYLATE 10 MG PO TABS
10.0000 mg | ORAL_TABLET | Freq: Every day | ORAL | Status: DC
  Administered 2013-07-11 – 2013-07-12 (×2): 10 mg via ORAL
  Filled 2013-07-10 (×2): qty 1
  Filled 2013-07-10: qty 14
  Filled 2013-07-10: qty 1

## 2013-07-10 MED ORDER — CLONIDINE HCL 0.1 MG PO TABS
0.1000 mg | ORAL_TABLET | ORAL | Status: DC
  Filled 2013-07-10 (×2): qty 1

## 2013-07-10 MED ORDER — TRAZODONE HCL 50 MG PO TABS
50.0000 mg | ORAL_TABLET | Freq: Every evening | ORAL | Status: DC | PRN
  Administered 2013-07-10 – 2013-07-12 (×3): 50 mg via ORAL
  Filled 2013-07-10 (×3): qty 1
  Filled 2013-07-10: qty 28
  Filled 2013-07-10 (×3): qty 1
  Filled 2013-07-10: qty 28

## 2013-07-10 MED ORDER — CLONIDINE HCL 0.1 MG PO TABS
0.1000 mg | ORAL_TABLET | Freq: Every day | ORAL | Status: DC
Start: 2013-07-16 — End: 2013-07-12

## 2013-07-10 MED ORDER — MAGNESIUM HYDROXIDE 400 MG/5ML PO SUSP: 30.0000 mL | Freq: Every day | ORAL | Status: DC | PRN

## 2013-07-10 MED ORDER — DICYCLOMINE HCL 20 MG PO TABS: 20.0000 mg | ORAL_TABLET | Freq: Four times a day (QID) | ORAL | Status: DC | PRN

## 2013-07-10 MED ORDER — BUDESONIDE-FORMOTEROL FUMARATE 80-4.5 MCG/ACT IN AERO
2.0000 | INHALATION_SPRAY | Freq: Two times a day (BID) | RESPIRATORY_TRACT | Status: DC
  Administered 2013-07-11 – 2013-07-12 (×3): 2 via RESPIRATORY_TRACT
  Filled 2013-07-10: qty 6.9

## 2013-07-10 MED ORDER — LOPERAMIDE HCL 2 MG PO CAPS: 2.0000 mg | ORAL_CAPSULE | ORAL | Status: DC | PRN

## 2013-07-10 MED ORDER — LEVETIRACETAM ER 500 MG PO TB24
1000.0000 mg | ORAL_TABLET | Freq: Every day | ORAL | Status: DC
  Administered 2013-07-11 – 2013-07-12 (×2): 1000 mg via ORAL
  Filled 2013-07-10: qty 2
  Filled 2013-07-10: qty 6
  Filled 2013-07-10 (×2): qty 2

## 2013-07-10 MED ORDER — CHLORDIAZEPOXIDE HCL 25 MG PO CAPS: 25.0000 mg | ORAL_CAPSULE | Freq: Four times a day (QID) | ORAL | Status: DC | PRN

## 2013-07-10 MED ORDER — ACETAMINOPHEN 325 MG PO TABS: 650.0000 mg | ORAL_TABLET | Freq: Four times a day (QID) | ORAL | Status: DC | PRN

## 2013-07-10 MED ORDER — ALUM & MAG HYDROXIDE-SIMETH 200-200-20 MG/5ML PO SUSP: 30.0000 mL | ORAL | Status: DC | PRN

## 2013-07-10 MED ORDER — NICOTINE POLACRILEX 2 MG MT GUM
2.0000 mg | CHEWING_GUM | OROMUCOSAL | Status: DC | PRN
  Administered 2013-07-10: 2 mg via ORAL

## 2013-07-10 NOTE — Progress Notes (Signed)
Patient ID: [name], male   DOB: 1971/11/15, 42 y.o.   MRN: [id number]  Admission Note: Patient is a 42 yo male admitted for depression and self harm. Pt presents with superficial scratches to bilateral arms and wrists. Per report, pt became angry when he was discharged from his pain clinic for "being high" on medications. Pt denies this, but is mainly focused on obtaining his next dose of pain medication. Pt talking very loudly and with pressured speech. Pt irritable and agitated with staff. Pt with glassy-looking eyes and appears to be drowsy between sentences. Pt easily agitated with questions from Clinical research associate. Pt denies SI/HI or plans to harm himself at this time.
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
## 2013-07-10 NOTE — Progress Notes (Signed)
Patient ID: [name], male   DOB: 1971-06-28, 42 y.o.   MRN: [id number] Report given to [facility] [place]

## 2013-07-10 NOTE — ED Notes (Signed)
Patient asked several time to give a urine sample but patient states that he doesn't has to go at this moment. Will continue to try to obtain sample.

## 2013-07-10 NOTE — ED Provider Notes (Signed)
Medical screening examination/treatment/procedure(s) were performed by non-physician practitioner and as supervising physician I was immediately available for consultation/collaboration.  Kristen N Ward, DO 07/10/13 1448 

## 2013-07-10 NOTE — Tx Team (Signed)
Initial Interdisciplinary Treatment Plan  PATIENT STRENGTHS: (choose at least two) Average or above average intelligence Supportive family/friends  PATIENT STRESSORS: Health problems Substance abuse   PROBLEM LIST: Problem List/Patient Goals Date to be addressed Date deferred Reason deferred Estimated date of resolution  Substance Abuse 07/10/13     Chronic Pain 07/10/13     Suicide Risk 07/10/13     Self-harm Risk 07/10/13                                    DISCHARGE CRITERIA:  Need for constant or close observation no longer present  PRELIMINARY DISCHARGE PLAN: Outpatient therapy  PATIENT/FAMIILY INVOLVEMENT: This treatment plan has been presented to and reviewed with the patient, [name], and/or family member.  The patient and family have been given the opportunity to ask questions and make suggestions.  [name] 07/10/2013, 8:49 PM

## 2013-07-10 NOTE — ED Provider Notes (Signed)
CSN: [id number]     Arrival date & time 07/09/13  1500 History     First MD Initiated Contact with Patient 07/09/13 1519     Chief Complaint  Patient presents with  . Medical Clearance   (Consider location/radiation/quality/duration/timing/severity/associated sxs/prior Treatment) HPI Patient presents to the emergency department with suicidal attempt.  Patient, apparently abuses Xanax, and opiates.  The patient attempted to cut his wrists bilaterally, earlier in the day.  Patient, states, that he is not having chest pain, shortness of breath, nausea, vomiting, abdominal pain, back pain, headache, blurred vision, weakness, numbness, dizziness, or hallucinations.  Patient has attempted suicide in the past Past Medical History  Diagnosis Date  . Anxiety   . Hypertension   . Seizure   . Suicidal behavior   . Asthma   . Chronic back pain    No past surgical history on file. Family History  Problem Relation Age of Onset  . Hypotension Mother   . Diabetes Mother    History  Substance Use Topics  . Smoking status: Current Every Day Smoker -- 1.00 packs/day for 1 years    Types: Cigarettes  . Smokeless tobacco: Not on file  . Alcohol Use: Yes    Review of Systems All other systems negative except as documented in the HPI. All pertinent positives and negatives as reviewed in the HPI. Allergies  Tramadol  Home Medications   Current Outpatient Rx  Name  Route  Sig  Dispense  Refill  . amLODipine (NORVASC) 10 MG tablet   Oral   Take 10 mg by mouth daily.         . budesonide-formoterol (SYMBICORT) 80-4.5 MCG/ACT inhaler   Inhalation   Inhale 2 puffs into the lungs 2 (two) times daily.         . carvedilol (COREG) 12.5 MG tablet   Oral   Take 12.5 mg by mouth 2 (two) times daily with a meal.         . clonazePAM (KLONOPIN) 0.5 MG tablet   Oral   Take 0.5 mg by mouth 4 (four) times daily.         . diphenhydramine-acetaminophen (TYLENOL PM) 25-500 MG TABS    Oral   Take 1 tablet by mouth at bedtime as needed.         . levETIRAcetam (KEPPRA XR) 500 MG 24 hr tablet   Oral   Take 1,000 mg by mouth daily.         Marland Kitchen oxyCODONE-acetaminophen (PERCOCET) 7.5-325 MG per tablet   Oral   Take 1 tablet by mouth every 4 (four) hours as needed for pain.         Marland Kitchen risperiDONE (RISPERDAL) 1 MG tablet   Oral   Take 1 mg by mouth 2 (two) times daily.          BP 116/76  Pulse 91  Temp(Src) 97.4 F (36.3 C) (Oral)  Resp 17  SpO2 96% Physical Exam  Constitutional: He appears well-developed and well-nourished. No distress.  HENT:  Head: Normocephalic.  Mouth/Throat: Oropharynx is clear and moist.  Eyes: Pupils are equal, round, and reactive to light.  Neck: Normal range of motion. Neck supple.  Cardiovascular: Normal rate, regular rhythm and normal heart sounds.  Exam reveals no gallop and no friction rub.   No murmur heard. Pulmonary/Chest: Effort normal and breath sounds normal. No respiratory distress.  Neurological: He is alert.  Skin: Skin is warm and dry. No rash noted.  Psychiatric: His  affect is angry and blunt. His speech is slurred. He is agitated. Cognition and memory are impaired. He expresses impulsivity. He expresses suicidal ideation. He expresses suicidal plans.    ED Course   Procedures (including critical care time)  Labs Reviewed  CBC - Abnormal; Notable for the following:    WBC 10.8 (*)    All other components within normal limits  COMPREHENSIVE METABOLIC PANEL - Abnormal; Notable for the following:    Sodium 133 (*)    Total Bilirubin 0.2 (*)    All other components within normal limits  ETHANOL  URINE RAPID DRUG SCREEN (HOSP PERFORMED)   Patient was placed under involuntary commitment and will need an act team assessment  MDM    Carlyle Dolly, PA-C 07/10/13 [phone]

## 2013-07-11 ENCOUNTER — Encounter (HOSPITAL_COMMUNITY): Payer: Self-pay | Admitting: Psychiatry

## 2013-07-11 DIAGNOSIS — F1120 Opioid dependence, uncomplicated: Secondary | ICD-10-CM | POA: Diagnosis present

## 2013-07-11 DIAGNOSIS — F1310 Sedative, hypnotic or anxiolytic abuse, uncomplicated: Secondary | ICD-10-CM | POA: Diagnosis present

## 2013-07-11 DIAGNOSIS — F329 Major depressive disorder, single episode, unspecified: Secondary | ICD-10-CM

## 2013-07-11 DIAGNOSIS — F411 Generalized anxiety disorder: Secondary | ICD-10-CM

## 2013-07-11 MED ORDER — NICOTINE 21 MG/24HR TD PT24
21.0000 mg | MEDICATED_PATCH | Freq: Every day | TRANSDERMAL | Status: DC
  Administered 2013-07-12: 21 mg via TRANSDERMAL
  Filled 2013-07-11 (×4): qty 1

## 2013-07-11 NOTE — Progress Notes (Signed)
Patient ID: [name], male   DOB: Jul 28, 1971, 42 y.o.   MRN: [id number] He was angry wanting to leave this AM because he was not ordered the Vicodin and keppra. Stated that he was going to call father and leave. Discharge process was explained to him and that he had to see Dr. Dub Mikes. About his medications and weather or not he would be discharged.

## 2013-07-11 NOTE — [facility] Suicide Risk Assessment (Signed)
Suicide Risk Assessment  Admission Assessment     Nursing information obtained from:  Patient Demographic factors:  Male;Caucasian;Living alone;Unemployed Current Mental Status:  NA Loss Factors:  Decline in physical health Historical Factors:  Family history of mental illness or substance abuse Risk Reduction Factors:  Responsible for children under 43 years of age;Sense of responsibility to family;Positive therapeutic relationship  CLINICAL FACTORS:   Depression:   Comorbid alcohol abuse/dependence Alcohol/Substance Abuse/Dependencies  COGNITIVE FEATURES THAT CONTRIBUTE TO RISK:  Closed-mindedness Polarized thinking Thought constriction (tunnel vision)    SUICIDE RISK:   Moderate:  Frequent suicidal ideation with limited intensity, and duration, some specificity in terms of plans, no associated intent, good self-control, limited dysphoria/symptomatology, some risk factors present, and identifiable protective factors, including available and accessible social support.  PLAN OF CARE: Supportive approach/copig skills/relapse prevention                               Detox/reassess and address the co morbidities  I certify that inpatient services furnished can reasonably be expected to improve the patient's condition.  [name] A 07/11/2013, 6:22 PM
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
## 2013-07-11 NOTE — [facility] Group Notes (Signed)
[facility] LCSW Group Therapy  07/11/2013 3:26 PM  Type of Therapy:  Group Therapy  Participation Level:  Active  Participation Quality:  Appropriate  Affect:  Appropriate  Cognitive:  Oriented  Insight:  Improving  Engagement in Therapy:  Improving  Modes of Intervention:  Discussion, Education, Exploration, Socialization and Support  Summary of Progress/Problems:  Finding Balance in Life. Today's group focused on defining balance in one's own words, identifying things that can knock one off balance, and exploring healthy ways to maintain balance in life. Group members were asked to provide an example of a time when they felt off balance, describe how they handled that situation,and process healthier ways to regain balance in the future. Group members were asked to share the most important tool for maintaining balance that they learned while at [facility] and how they plan to apply this method after discharge. Jashon was attentive and engaged throughout today's group. He stated that to him, balance meant "setting goals and meeting them." Cayman stated that depression and dealing with his divorce have thrown him off balance recently left him unable to cope appropriately. Sol acknowledged that self care is important for balance and identified "better sleep, eating healthy, and going back to the gym" as goals that he hopes to achieve when he returns home from [facility].    Smart, Tyreon Frigon 07/11/2013, 3:26 PM

## 2013-07-11 NOTE — Progress Notes (Signed)
Patient ID: [name], male   DOB: Dec 21, 1970, 42 y.o.   MRN: [id number] Pt has been irritable most of the evening demanding to get his seizure and pain medicine. Writer explained to pt multiple times that his medicine was given earlier at the ER. Pt reports the dose was inaccurate and wanted some more before he has a seizure. Pt refused his other scheduled medications and asked to sign a 72 hr request for discharge. Once explained pt refused to sign it and wanted to walked out of building. PA and charge nurse talked to pt about staying the night and talking to the doctor in the morning. Pt later took his medications and went to sleep.
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
## 2013-07-11 NOTE — Progress Notes (Signed)
Adult Psychoeducational Group Note  Date:  07/11/2013 Time:  2:13 PM  Group Topic/Focus:  Building Self Esteem:   The Focus of this group is helping patients become aware of the effects of self-esteem on their lives, the things they and others do that enhance or undermine their self-esteem, seeing the relationship between their level of self-esteem and the choices they make and learning ways to enhance self-esteem.  Participation Level:  Active  Participation Quality:  Appropriate and Attentive  Affect:  Appropriate  Cognitive:  Alert and Appropriate  Insight: Good  Engagement in Group:  Engaged  Modes of Intervention:  Discussion, Socialization and Support  Additional Comments:  Pt came to group and shared that inactivity and negative family life were effecting his self-esteem. Pt plans on changing this by working out more and developing a better relationship with his son to increase his self-esteem.  Cathlean Cower 07/11/2013, 2:13 PM

## 2013-07-11 NOTE — Progress Notes (Signed)
Patient ID: [name], male   DOB: 22-Feb-1971, 42 y.o.   MRN: [id number] D: Pt is awake and active on the unit this AM. Pt denies SI/HI and A/V hallucinations. Pt rates their depression at 10 and hopelessness at 10. Pt's mood is flat/depressed and his affect is blunted. Pt writes that he wants to "take all of his medications as prescribed." Pt has some thought blocking and is mildly confused but he is interacting appropriately with staff and peers.   A: Encouraged pt to discuss feelings with staff and administered medication per MD orders. Writer also encouraged pt to participate in groups.  R: Pt is attending groups and tolerating medications well. Writer will continue to monitor. 15 minute checks are ongoing for safety.

## 2013-07-11 NOTE — [facility] Suicide Risk Assessment (Signed)
[facility] INPATIENT:  Family/Significant Other Suicide Prevention Education  Suicide Prevention Education:  Education Completed: [name] (pt's father) has been identified by the patient as the family member/significant other with whom the patient will be residing, and identified as the person(s) who will aid the patient in the event of a mental health crisis (suicidal ideations/suicide attempt).  With written consent from the patient, the family member/significant other has been provided the following suicide prevention education, prior to the and/or following the discharge of the patient.  The suicide prevention education provided includes the following:  Suicide risk factors  Suicide prevention and interventions  National Suicide Hotline telephone number  [facility] assessment telephone number  [facility] Emergency Assistance 911  [facility] and/or Residential Mobile Crisis Unit telephone number  Request made of family/significant other to:  Remove weapons (e.g., guns, rifles, knives), all items previously/currently identified as safety concern.    Remove drugs/medications (over-the-counter, prescriptions, illicit drugs), all items previously/currently identified as a safety concern.  The family member/significant other verbalizes understanding of the suicide prevention education information provided.  The family member/significant other agrees to remove the items of safety concern listed above.  Smart, [name] 07/11/2013, 11:13 AM

## 2013-07-12 DIAGNOSIS — F1120 Opioid dependence, uncomplicated: Principal | ICD-10-CM

## 2013-07-12 DIAGNOSIS — F329 Major depressive disorder, single episode, unspecified: Secondary | ICD-10-CM

## 2013-07-12 MED ORDER — LEVETIRACETAM ER 500 MG PO TB24: 1000.0000 mg | ORAL_TABLET | Freq: Every day | ORAL | Status: DC

## 2013-07-12 MED ORDER — AMLODIPINE BESYLATE 10 MG PO TABS: 10.0000 mg | ORAL_TABLET | Freq: Every day | ORAL | Status: DC

## 2013-07-12 MED ORDER — CARVEDILOL 12.5 MG PO TABS: 12.5000 mg | ORAL_TABLET | Freq: Two times a day (BID) | ORAL | Status: DC

## 2013-07-12 MED ORDER — TRAZODONE HCL 50 MG PO TABS: 50.0000 mg | ORAL_TABLET | Freq: Every evening | ORAL | Status: DC | PRN

## 2013-07-12 MED ORDER — RISPERIDONE 1 MG PO TABS: 1.0000 mg | ORAL_TABLET | Freq: Two times a day (BID) | ORAL | Status: DC

## 2013-07-12 MED ORDER — BUDESONIDE-FORMOTEROL FUMARATE 80-4.5 MCG/ACT IN AERO: 2.0000 | INHALATION_SPRAY | Freq: Two times a day (BID) | RESPIRATORY_TRACT | Status: DC

## 2013-07-12 MED ORDER — HYDROXYZINE HCL 25 MG PO TABS: 25.0000 mg | ORAL_TABLET | Freq: Four times a day (QID) | ORAL | Status: DC | PRN

## 2013-07-12 NOTE — Progress Notes (Signed)
Pt d/c from hospital with his family. All items returned. D/C instructions given with teach back. Prescriptions and samples given. Pt denies si and hi.

## 2013-07-12 NOTE — Progress Notes (Signed)
Pt has only slept 1 hr tonight even with admin of trazadone x 2. He is groggy with slurred speech and unsteady on feet. He has awakened roommate numerous times tonight rummaging through his belongings, showering, coming out into hallway several times an hour, laughing and talking to self. 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 Note left for MD to consider no roommate order. Fall precautions in place, reviewed with pt and strongly encouraged however pt remains noncompliant. Will continue to monitor closely. Lawrence Marseilles

## 2013-07-12 NOTE — Progress Notes (Signed)
Adult Psychoeducational Group Note  Date:  07/12/2013 Time:  1:47 PM  Group Topic/Focus:  Relapse Prevention Planning:   The focus of this group is to define relapse and discuss the need for planning to combat relapse.  Participation Level:  Active  Participation Quality:  Appropriate  Affect:  Appropriate  Cognitive:  Appropriate  Insight: Lacking  Engagement in Group:  Engaged  Modes of Intervention:  Discussion, Education, Exploration and Support  Additional Comments:  Pt actively participated in a group discussion on recovery and relapse prevention. Pt defined recovery as "getting better mentally and physically."  Elita Quick 07/12/2013, 1:47 PM

## 2013-07-12 NOTE — Progress Notes (Signed)
Recreation Therapy Notes  Date: 08.21.2014 Time: 3:00pm Location: [address] Dayroom  Group Topic: Communication, Team Building, Problem Solving  Goal Area(s) Addresses:  Patient will be able to recognize use of communication, team building and problem solving during course of group activity. Patient will verbalize need for communication, team building and problem solving to make group activity successful.  Patient will verbalize benefit of communication, team building and problem solving to post d/c goals.   Behavioral Response: Did not attend.  Marykay Lex Jaxtin Raimondo, LRT/CTRS  Adrinne Sze L 07/12/2013 8:03 AM

## 2013-07-12 NOTE — Progress Notes (Signed)
D:Pt's mood is labile changing from irritable stating no one has given me a nicotine patch since I came to the hospital to being pleasant and cooperative. Pt denies any detox symptoms at this time. Pt is restless at times and was awake on and off through the night.   A:Offered encouragement and 15 minute checks. Gave medication as ordered and support as needed. R:Pt denies si and hi. Safety maintained on the unit.

## 2013-07-12 NOTE — [facility] Group Notes (Signed)
[facility] LCSW Aftercare Discharge Planning Group Note   07/12/2013 10:31 AM  Participation Quality:  Appropriate   Mood/Affect:  Appropriate  Depression Rating:  0  Anxiety Rating:  0  Thoughts of Suicide:  No Will you contract for safety?   NA  Current AVH:  No  Plan for Discharge/Comments:  Pt has appts scheduled with Selinda Eon and with Colen Darling for therapy. Pt scheduled for d/c today. He feels that he is "ready to go home and get back to normal life."   Transportation Means: father  Supports: Financial controller, Research scientist (physical sciences)

## 2013-07-12 NOTE — Plan of Care (Signed)
Problem: Ineffective individual coping Goal: STG: Patient will remain free from self harm Outcome: Completed/Met Date Met:  07/12/13 Pt denies si and hi  Problem: Diagnosis: Increased Risk For Suicide Attempt Goal: STG-Patient Will Report Suicidal Feelings to Staff Outcome: Completed/Met Date Met:  07/12/13 Denies si and hi

## 2013-07-12 NOTE — Progress Notes (Signed)
[facility] Adult Case Management Discharge Plan :  Will you be returning to the same living situation after discharge: Yes,  home  At discharge, do you have transportation home?:Yes, father Do you have the ability to pay for your medications:Yes,  private insurance  Release of information consent forms completed and in the chart;  Patient's signature needed at discharge.  Patient to Follow up at: Follow-up Information   Follow up with [facility] Edmonia James NP On 07/17/2013. (Appt. at 11:15AM for hospital followup with Kathlene November. )    Contact information:   [phone] [place], [place] [address] Phone: [phone] Fax: [phone]      Follow up with Andee Poles Stephanie Coup  On 07/26/2013. (Appt at 11AM with Misty Stanley for therapy)    Contact information:   [address] [place], [place] [address] Phone: [phone] Fax:       Patient denies SI/HI:   Yes,  during group/self report    Safety Planning and Suicide Prevention discussed:  Yes,  SPE completed with pt's father. Pt given SPI pamphelt and encouraged to ask questions and talk about concerns.   Smart, [name] 07/12/2013, 11:31 AM

## 2013-07-12 NOTE — Discharge Summary (Signed)
Physician Discharge Summary Note  Patient:  [name] is an 42 y.o., male MRN:  [id number] DOB:  May 02, 1971 Patient phone:  [phone] (home)  Patient address:   [address] [place] [address],   Date of Admission:  07/10/2013 Date of Discharge: 07/12/13  Reason for Admission:  Opioid/benzodiazepine detox  Discharge Diagnoses: Active Problems:   Anxiety   Depression   Benzodiazepine abuse   Opioid dependence  Review of Systems  Constitutional: Negative.   HENT: Negative.   Eyes: Negative.   Respiratory: Negative.   Cardiovascular: Negative.   Gastrointestinal: Negative.   Genitourinary: Negative.   Musculoskeletal: Negative.   Skin: Negative.   Neurological: Negative.   Endo/Heme/Allergies: Negative.   Psychiatric/Behavioral: Positive for substance abuse (Opioid dependence, Benzodiazepine abuse). Negative for depression, suicidal ideas, hallucinations and memory loss. The patient is nervous/anxious (Stabilized with medication prior to discharge) and has insomnia (Stabilized with medication prior to discharge).     DSM5:  Schizophrenia Disorders:  NA Obsessive-Compulsive Disorders:  NA Trauma-Stressor Disorders:  NA Substance/Addictive Disorders:  Opioid Disorder - Severe (304.00) Depressive Disorders:  Major Depressive Disorder - Moderate (296.22)  Axis Diagnosis:   AXIS I:  Opioid disorder, Major depression AXIS II:  Deferred AXIS III:   Past Medical History  Diagnosis Date  . Anxiety   . Hypertension   . Seizure   . Suicidal behavior   . Asthma   . Chronic back pain    AXIS IV:  other psychosocial or environmental problems and substance abuse/dependence AXIS V:  64  Level of Care:  OP  Hospital Course:  42 Y/O male who states he is having problems with depression. The main stressor he states is dealing with his ex wife in trying to see his kids. states that four days ago she was trying to get more money from his for child support. He cut  his wrist superficially. States that this was impulsive. He put a band aid. He called his dad who came over to talk to him he took him to the ED. He admits he used to abuse Xanax but not anymore. Claims he is being prescribed opioids that he does not abuse (information in the chart suggests that he does, later confirmed by his father).  Upon admission into this hospital, and after admission assessment/evaluation, it was determined based on UDS reports and verbal reports from patient's family that he has been abusing opiates that Mr. Soltys will need detoxification treatment protocol to stabilize his systems of drug intoxication and to combat the withdrawal symptoms of these substances as well. His UDS repport was (+) for benzodiazepine. He was then started on clonidine detoxification treatment protocol for his opiate detoxification and PRN Librium capsules to combat any withdrawal symptoms associated with Benzodiazepine. He was also enrolled in group counseling sessions and activities where he counseled, taught and learned coping skills that should help him after discharge to cope better with his symptoms and manage his substance abuse/dependency issues for a much sustained sobreity. He also was enrolled/participated in the AA/NA meetings being offered and held on this unit. He has or rather presented with previously existing and or identifiable medical conditions that required treatment and or monitoring. He received medication management for those health issues. He was monitored closely for any potential problems that may arise as a result of and or during detoxification treatment. Patient tolerated his detoxification treatment regimen without any significant adverse effects and or reactions reported and or presented.  Besides the detoxification  treatment protocol, Mr. Wohler was ordered and received Hydroxyzine 25 mg Q 6 hours for anxiety, Risperdal 1 mg bid for mood control and Trazodone 50 mg Q bedtime for  sleep. Patient attended treatment team meeting this am and met with the treatment team members. His reason for admission, present symptoms, substance abuse issues, response to treatment and discharge plans discussed. Patient endorsed that he is doing well and stable for discharge to pursue the next phase of his substance abuse and routine psychiatric treatment on an outpatient basis. It was then agreed upon between patient and the team that he will be discharged to follow-up at the [facility] psychiatric clinic with Carolynn Sayers, PA on 07/17/13 at 11:15 am for medication management and with Colen Darling on 07/26/13 at 11:00 am for counseling sessions. The address, dates and time and contact information for this clinic provided for patient in writing.   Upon discharge, patient adamantly denies suicidal, homicidal ideations, auditory, visual hallucinations, delusional thoughts, paranoia and or withdrawal symptoms. Patient left [facility] with all personal belongings in no apparent distress. He received 2 weeks worth supply samples of his [facility] discharged medications, including a 30 days worth prescriptions for all his discharge medications. Transportation per father.  Consults:  psychiatry  Significant Diagnostic Studies:  labs: CBC with diff, CMP, UDS, Toxicology tests, U/A  Discharge Vitals:   Blood pressure 129/91, pulse 81, temperature 98.8 F (37.1 C), temperature source Oral, resp. rate 16, height 5\' 7"  (1.702 m), weight 85.276 kg (188 lb). Body mass index is 29.44 kg/(m^2). Lab Results:   No results found for this or any previous visit (from the past 72 hour(s)).  Physical Findings: AIMS: Facial and Oral Movements Muscles of Facial Expression: None, normal Lips and Perioral Area: None, normal Jaw: None, normal Tongue: None, normal,Extremity Movements Upper (arms, wrists, hands, fingers): None, normal Lower (legs, knees, ankles, toes): None, normal, Trunk Movements Neck, shoulders, hips:  None, normal, Overall Severity Severity of abnormal movements (highest score from questions above): None, normal Incapacitation due to abnormal movements: None, normal Patient's awareness of abnormal movements (rate only patient's report): No Awareness, Dental Status Current problems with teeth and/or dentures?: No Does patient usually wear dentures?: No  CIWA:  CIWA-Ar Total: 3 COWS:  COWS Total Score: 1  Psychiatric Specialty Exam: See Psychiatric Specialty Exam and Suicide Risk Assessment completed by Attending Physician prior to discharge.  Discharge destination:  Home  Is patient on multiple antipsychotic therapies at discharge:  No   Has Patient had three or more failed trials of antipsychotic monotherapy by history:  No  Recommended Plan for Multiple Antipsychotic Therapies: NA      Discharge Orders   Future Orders Complete By Expires   Discharge instructions  As directed    Comments:     Take all your medicines as prescribed by your psychiatrist. Report any adverse effects/reactions to your outpatient provider. Keep all scheduled follow-up appointments as recommended. Follow-up with your primary care provider for your other medical issues and or concerns. Do not drink alcohol beverages and or engage in illegal drug use while on prescription medicines. In the event of worsening symptoms, call the crisis hot-line, 911 and or go to the nearest ED for evaluation/treatment.       Medication List    STOP taking these medications       clonazePAM 0.5 MG tablet  Commonly known as:  KLONOPIN     diphenhydramine-acetaminophen 25-500 MG Tabs  Commonly known as:  TYLENOL PM  oxyCODONE-acetaminophen 7.5-325 MG per tablet  Commonly known as:  PERCOCET      TAKE these medications     Indication   amLODipine 10 MG tablet  Commonly known as:  NORVASC  Take 1 tablet (10 mg total) by mouth daily. For high blood pressure control   Indication:  High Blood Pressure      budesonide-formoterol 80-4.5 MCG/ACT inhaler  Commonly known as:  SYMBICORT  Inhale 2 puffs into the lungs 2 (two) times daily. For Asthma   Indication:  Asthma     carvedilol 12.5 MG tablet  Commonly known as:  COREG  Take 1 tablet (12.5 mg total) by mouth 2 (two) times daily with a meal. For high blood pressure control   Indication:  High Blood Pressure of Unknown Cause     hydrOXYzine 25 MG tablet  Commonly known as:  ATARAX/VISTARIL  Take 1 tablet (25 mg total) by mouth every 6 (six) hours as needed for anxiety.   Indication:  Anxiety associated with Organic Disease     levETIRAcetam 500 MG 24 hr tablet  Commonly known as:  KEPPRA XR  Take 2 tablets (1,000 mg total) by mouth daily. For seizure activities   Indication:  Partial Onset Seizures     risperiDONE 1 MG tablet  Commonly known as:  RISPERDAL  Take 1 tablet (1 mg total) by mouth 2 (two) times daily. For mood control   Indication:  Mood control     traZODone 50 MG tablet  Commonly known as:  DESYREL  Take 1 tablet (50 mg total) by mouth at bedtime and may repeat dose one time if needed. For sleep   Indication:  Trouble Sleeping       Follow-up Information   Follow up with [facility] Edmonia James NP On 07/17/2013. (Appt. at 11:15AM for hospital followup with Kathlene November. )    Contact information:   [phone] [place], [place] [address] Phone: [phone] Fax: [phone]      Follow up with Andee Poles Stephanie Coup  On 07/26/2013. (Appt at 11AM with Misty Stanley for therapy)    Contact information:   [address] [place], [place] [address] Phone: [phone] Fax:      Follow-up recommendations: Activity:  As tolerated Diet: As recommended by your primary care doctor. Keep all scheduled follow-up appointments as recommended.   Continue to work a relapse prevention plan and on the lifestyle changes that could help better manage your anxiety Comments: Take all your medications as  prescribed by your mental healthcare provider. Report any adverse effects and or reactions from your medicines to your outpatient provider promptly. Patient is instructed and cautioned to not engage in alcohol and or illegal drug use while on prescription medicines. In the event of worsening symptoms, patient is instructed to call the crisis hotline, 911 and or go to the nearest ED for appropriate evaluation and treatment of symptoms. Follow-up with your primary care provider for your other medical issues, concerns and or health care needs.     Total Discharge Time:  Greater than 30 minutes.  SignedArmandina Stammer I 07/14/2013, 4:41 PM

## 2013-07-12 NOTE — [facility] Suicide Risk Assessment (Signed)
Suicide Risk Assessment  Discharge Assessment     Demographic Factors:  Male, Divorced or widowed and Caucasian  Mental Status Per Nursing Assessment::   On Admission:  NA  Current Mental Status by Physician: In full contact with reality. There are no suicidal ideas plans or intent. He expressed awareness of the need to abstain from using benzodiazepines and opioids. He admits he was told by his MD he is not going to be getting any more prescriptions for opioids. He is committed not to get them from the streets. He states he is committed to be a good father to his kids. He is counting on his father being supportive. He will go back to follow up with his therapist and prescriber.   Loss Factors: Decrease in vocational status and Decline in physical health  Historical Factors: NA  Risk Reduction Factors:   Responsible for children under 53 years of age, Sense of responsibility to family, Living with another person, especially a relative and Positive social support  Continued Clinical Symptoms:  Depression:   Comorbid alcohol abuse/dependence Alcohol/Substance Abuse/Dependencies  Cognitive Features That Contribute To Risk:  Closed-mindedness Polarized thinking Thought constriction (tunnel vision)    Suicide Risk:  Minimal: No identifiable suicidal ideation.  Patients presenting with no risk factors but with morbid ruminations; may be classified as minimal risk based on the severity of the depressive symptoms  Discharge Diagnoses:   AXIS I:  Opioid, Benzodiazepine Dependence, Anxiety disorder NOS, Depressive Disorder NOS AXIS II:  Deferred AXIS III:   Past Medical History  Diagnosis Date  . Anxiety   . Hypertension   . Seizure   . Suicidal behavior   . Asthma   . Chronic back pain    AXIS IV:  other psychosocial or environmental problems AXIS V:  61-70 mild symptoms  Plan Of Care/Follow-up recommendations:  Activity:  as tolerated Diet:  regular Pursue outpatient  follow up, continue to work his relapse prevention plan Is patient on multiple antipsychotic therapies at discharge:  No   Has Patient had three or more failed trials of antipsychotic monotherapy by history:  No  Recommended Plan for Multiple Antipsychotic Therapies: NA  [name] A 07/12/2013, 12:26 PM

## 2013-07-12 NOTE — Tx Team (Signed)
Interdisciplinary Treatment Plan Update (Adult)  Date: 07/12/2013   Time Reviewed: 11:28 AM  Progress in Treatment:  Attending groups: Yes Participating in groups:  Yes Taking medication as prescribed: Yes  Tolerating medication: Yes  Family/Significant othe contact made: Yes, SPE complete with pt's father.   Patient understands diagnosis: Yes, AEB seeking treatment for SI, depression and ETOH detox.  Discussing patient identified problems/goals with staff: Yes  Medical problems stabilized or resolved: Yes  Denies suicidal/homicidal ideation: Yes in group/self report.  Patient has not harmed self or Others: Yes  New problem(s) identified: n/a  Discharge Plan or Barriers: P set up with Suszanne Conners for med management and Colen Darling for therapy. Pt will go home today and return to his apt.  Additional comments: 42 Y/O male who states he is having problems with depression. The main stressor he states is dealing with his ex wife in trying to see his kids. states that four days ago she was trying to get more money from his for child support. He cut his wrist superficially. States that this was impulsive. He put a band aid. He called his dad who came over to talk to him he took him to the ED. He admits he used to abuse Xanax but not anymore. Claims he is being prescribed opioids that he does not abuse (information in the chart suggests that he does, later confirmed by his father) Reason for Continuation of Hospitalization: d/c today Estimated length of stay: d/c today For review of initial/current patient goals, please see plan of care.  Attendees:  Patient:    Family:    Physician: Geoffery Lyons MD 07/12/2013 11:27 AM   Nursing: Vanessa Kick RN 07/12/2013 11:27 AM   Clinical Social Worker Harald Quevedo Smart, LCSWA  07/12/2013 11:27 AM   Other: Conni Slipper RN 07/12/2013 11:27 AM   Other: Aggie PA 07/12/2013 11:27 AM   Other:   Other:    Scribe for Treatment Team:  [employer] LCSWA 07/12/2013 11:28 AM

## 2013-07-12 NOTE — [facility] Group Notes (Signed)
[facility] LCSW Group Therapy  07/12/2013 2:54 PM  Type of Therapy:  Group Therapy  Participation Level:  Active  Participation Quality:  Attentive  Affect:  Appropriate  Cognitive:  Appropriate  Insight:  Engaged  Engagement in Therapy:  Engaged  Modes of Intervention:  Discussion, Education, Exploration, Socialization and Support  Summary of Progress/Problems: Feelings around Relapse. Group members discussed the meaning of relapse and shared personal stories of relapse, how it affected them and others, and how they perceived themselves during this time. Group members were encouraged to identify triggers, warning signs and coping skills used when facing the possibility of relapse. Social supports were discussed and explored in detail. Post Acute Withdrawal Syndrome (handout provided) was introduced and examined. Pt's were encouraged to ask questions, talk about key points associated with PAWS, and process this information in terms of relapse prevention. [name] was attentive and engaged throughout today's group. He followed along as CSW reviewed information pertaining to PAWS. [name] stated that he recalls feeling these symptoms at various times in his life when he attempted to cut down on the amount of pain pills used. [name] shows progress in the group setting and improving insight AEB his ability to actively participate in group conversation without prompting from CSW and his ability to acknowledge the importance of learning about PAWS in order to adequately prepare for this part of recovery.    Smart, Printice Hellmer 07/12/2013, 2:54 PM

## 2013-07-17 NOTE — Progress Notes (Signed)
Patient Discharge Instructions:  After Visit Summary (AVS):   Faxed to:  07/17/13 Discharge Summary Note:   Faxed to:  07/17/13 Psychiatric Admission Assessment Note:   Faxed to:  07/17/13 Suicide Risk Assessment - Discharge Assessment:   Faxed to:  07/17/13 Faxed/Sent to the Next Level Care provider:  07/17/13 Faxed to [facility] @ [phone]  Jerelene Redden, 07/17/2013, 4:42 PM

## 2013-07-17 NOTE — ED Provider Notes (Signed)
On  07/09/2013, this patient was seen by Ebbie Ridge, PA-C. I was not involved in the care of this patient.  Please refer to PA, Lawyers note for HPI, ROS, PE, and dispo.    Dorthula Matas, PA-C 07/17/13 [phone]

## 2013-07-18 NOTE — ED Provider Notes (Signed)
Medical screening examination/treatment/procedure(s) were performed by non-physician practitioner and as supervising physician I was immediately available for consultation/collaboration.  Layla Maw Adanely Reynoso, DO 07/18/13 1055

## 2013-09-10 ENCOUNTER — Encounter: Payer: Self-pay | Admitting: Nurse Practitioner

## 2013-09-10 ENCOUNTER — Ambulatory Visit (INDEPENDENT_AMBULATORY_CARE_PROVIDER_SITE_OTHER): Payer: Medicare Other | Admitting: Nurse Practitioner

## 2013-09-10 VITALS — BP 129/90 | HR 102 | Ht 69.0 in | Wt 200.0 lb

## 2013-09-10 DIAGNOSIS — S0990XA Unspecified injury of head, initial encounter: Secondary | ICD-10-CM

## 2013-09-10 DIAGNOSIS — G40319 Generalized idiopathic epilepsy and epileptic syndromes, intractable, without status epilepticus: Secondary | ICD-10-CM

## 2013-09-10 MED ORDER — LEVETIRACETAM ER 500 MG PO TB24: 500.0000 mg | ORAL_TABLET | Freq: Every day | ORAL | Status: DC

## 2013-09-10 NOTE — Patient Instructions (Addendum)
Increase Keppra to 3 tabs daily Will call in new dose F/U in 6 months
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
## 2013-09-13 ENCOUNTER — Encounter: Payer: Self-pay | Admitting: Neurology

## 2013-09-13 ENCOUNTER — Telehealth: Payer: Self-pay | Admitting: Neurology

## 2013-09-13 DIAGNOSIS — G479 Sleep disorder, unspecified: Secondary | ICD-10-CM

## 2013-09-13 DIAGNOSIS — E669 Obesity, unspecified: Secondary | ICD-10-CM

## 2013-09-13 DIAGNOSIS — G40909 Epilepsy, unspecified, not intractable, without status epilepticus: Secondary | ICD-10-CM

## 2013-09-13 DIAGNOSIS — G478 Other sleep disorders: Secondary | ICD-10-CM
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
## 2013-09-13 NOTE — Telephone Encounter (Signed)
After reviewing the sleep study referral, I entered a split night sleep study request on this patient, thanks.  Markasia Carrol, MD, PhD [facility] ([facility])    

## 2013-10-01 NOTE — Telephone Encounter (Signed)
fyi - This patient originally scheduled his sleep study for 10/02/2013, but called yesterday and canceled his appt saying that he did not feel that it was necessary to have.  Advised the patient that the referring provider would be notified.

## 2013-10-18 ENCOUNTER — Other Ambulatory Visit: Payer: Self-pay

## 2013-10-18 MED ORDER — LEVETIRACETAM ER 500 MG PO TB24: 1500.0000 mg | ORAL_TABLET | Freq: Every day | ORAL | Status: DC

## 2013-10-18 NOTE — Telephone Encounter (Signed)
Pharmacy requests 90 day Rx  

## 2014-03-17 ENCOUNTER — Other Ambulatory Visit: Payer: Self-pay | Admitting: Neurology

## 2014-03-25 ENCOUNTER — Ambulatory Visit (INDEPENDENT_AMBULATORY_CARE_PROVIDER_SITE_OTHER): Payer: Medicare Other | Admitting: Neurology

## 2014-03-25 ENCOUNTER — Encounter (INDEPENDENT_AMBULATORY_CARE_PROVIDER_SITE_OTHER): Payer: Self-pay

## 2014-03-25 ENCOUNTER — Encounter: Payer: Self-pay | Admitting: Neurology

## 2014-03-25 VITALS — BP 130/94 | HR 117 | Ht 69.0 in | Wt 203.0 lb

## 2014-03-25 DIAGNOSIS — F32A Depression, unspecified: Secondary | ICD-10-CM

## 2014-03-25 DIAGNOSIS — F1310 Sedative, hypnotic or anxiolytic abuse, uncomplicated: Secondary | ICD-10-CM

## 2014-03-25 DIAGNOSIS — R569 Unspecified convulsions: Secondary | ICD-10-CM

## 2014-03-25 DIAGNOSIS — F329 Major depressive disorder, single episode, unspecified: Secondary | ICD-10-CM

## 2014-03-25 DIAGNOSIS — F3289 Other specified depressive episodes: Secondary | ICD-10-CM

## 2014-03-25 MED ORDER — LAMOTRIGINE 25 MG PO TABS: ORAL_TABLET | ORAL | Status: DC

## 2014-03-25 MED ORDER — LAMOTRIGINE 100 MG PO TABS: 100.0000 mg | ORAL_TABLET | Freq: Two times a day (BID) | ORAL | Status: DC

## 2014-03-25 MED ORDER — LAMOTRIGINE 100 MG PO TABS: 100.0000 mg | ORAL_TABLET | Freq: Every day | ORAL | Status: DC
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
## 2014-03-25 NOTE — Patient Instructions (Signed)
Start taking Lamotrigin once you get the prescription.  1. taking Lamotrigin as described on the bottle, 25mg  titrating up to 100mg  twice a day. 2 Tapering off levotiracetam ER, you are taking 500mg  one in the morning and 2 tabs at night now  1st week, 0/2 2nd week 0/2 3rd week 0/1. 4th week 0/1  5th week 0/0, then you should only take Lamotrigin 100 mg twice a day

## 2014-04-17 ENCOUNTER — Other Ambulatory Visit: Payer: Self-pay | Admitting: Neurology

## 2014-04-18 ENCOUNTER — Telehealth: Payer: Self-pay | Admitting: Neurology

## 2014-04-18 NOTE — Telephone Encounter (Signed)
I called back.  Spoke with dad.  He says the patient decided to stop taking Lamictal because he "felt like it wasn't the right drug for him and he felt like he might have a seizure".  Dad said the patient started taking Keppra again at previously prescribed dose and would like to continue to take Keppra.  Please advise.  Thank you.

## 2014-04-18 NOTE — Telephone Encounter (Signed)
Patient's Father is calling--Keppra medication was changed to Lamoprigine about 3 weeks ago---after taking Lamoprigine he has signs of having a seizure--patient has gone back to taking Keppra which works just fine--can Rx for Keppra be called to [facility] in [place] and have them put Rx on hold until patient needs it--thank you.

## 2014-04-18 NOTE — Telephone Encounter (Signed)
I have called his father, he does not lamictal, "like he is getting seizure", now he has stopped lamictal, back on keppra

## 2014-07-24 ENCOUNTER — Ambulatory Visit (INDEPENDENT_AMBULATORY_CARE_PROVIDER_SITE_OTHER): Payer: Medicare Other | Admitting: Emergency Medicine

## 2014-07-24 VITALS — BP 122/92 | HR 110 | Temp 97.8°F | Resp 22 | Ht 67.5 in | Wt 199.4 lb

## 2014-07-24 DIAGNOSIS — F1311 Sedative, hypnotic or anxiolytic abuse, in remission: Secondary | ICD-10-CM

## 2014-07-24 DIAGNOSIS — F1310 Sedative, hypnotic or anxiolytic abuse, uncomplicated: Secondary | ICD-10-CM

## 2014-07-24 DIAGNOSIS — F1111 Opioid abuse, in remission: Secondary | ICD-10-CM

## 2014-07-24 NOTE — Progress Notes (Signed)
[facility] [address] [place] [address] [phone]  Date:  07/24/2014   Name:  [name]   DOB:  1971/01/13   MRN:  [id number]  PCP:  Nonnie Done., MD    Chief Complaint: Anxiety   History of Present Illness:  [name] is a 43 y.o. very pleasant male patient who presents with the following:  Patient has been out of rehab and sober for 80 days following addiction to prescription narcotics and abuse of benzodiazepines.   Says he has not slept for 80 days and is cranky with his family.  He requests a prescription for clonipine or xanax to help him "relax".  Says he has been on "all" of the antidepressants and none work.  Likewise seroquel and trazodone do not work for him.   He is threatening to go out and get "something' on the street to help him. He is not suicidal and has no thought of self harm stated. Says his therapist is out of the country for three weeks Accompanied by his father. No improvement with over the counter medications or other home remedies. Denies other complaint or health concern today.   Patient Active Problem List   Diagnosis Date Noted  . Seizures 03/25/2014  . 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 Head injury, unspecified 09/10/2013  . Benzodiazepine abuse 07/11/2013  . Opioid dependence 07/11/2013  . Chest pain, pleuritic 01/04/2012  . Anxiety 01/02/2012  . Depression 01/02/2012  . HTN (hypertension) 01/02/2012  . Pneumonia 01/02/2012  . Hypokalemia 01/02/2012  . Leukocytosis 01/02/2012  . Benzodiazepine overdose 01/01/2012    Past Medical History  Diagnosis Date  . Anxiety   . Hypertension   . Seizure   . Suicidal behavior   . Asthma   . Chronic back pain   . Depression     History reviewed. No pertinent past surgical history.  History  Substance Use Topics  . Smoking status: Current Every Day Smoker -- 1.00 packs/day for 1 years    Types: Cigarettes  .  Smokeless tobacco: Never Used  . Alcohol Use: No     Comment: stopped 5 years ago    Family History  Problem Relation Age of Onset  . Hypotension Mother   . Diabetes Mother     Allergies  Allergen Reactions  . Tramadol     unknown    Medication list has been reviewed and updated.  Current Outpatient Prescriptions on File Prior to Visit  Medication Sig Dispense Refill  . carvedilol (COREG) 12.5 MG tablet Take 1 tablet (12.5 mg total) by mouth 2 (two) times daily with a meal. For high blood pressure control      . levETIRAcetam (KEPPRA XR) 500 MG 24 hr tablet TAKE 3 TABLETS BY MOUTH EVERY DAY  270 tablet  0  . PROAIR HFA 108 (90 BASE) MCG/ACT inhaler        No current facility-administered medications on file prior to visit.    Review of Systems:  As per HPI, otherwise negative.    Physical Examination: Filed Vitals:   07/24/14 1742  BP: 122/92  Pulse: 110  Temp: 97.8 F (36.6 C)  Resp: 22   Filed Vitals:   07/24/14 1742  Height: 5' 7.5" (1.715 m)  Weight: 199 lb 6.4 oz (90.447 kg)   Body mass index is 30.75 kg/(m^2). Ideal Body Weight: Weight in (lb) to have BMI = 25: 161.7   GEN: WDWN, NAD,  Non-toxic, Alert & Oriented x 3 HEENT: Atraumatic, Normocephalic.  Ears and Nose: No external deformity. EXTR: No clubbing/cyanosis/edema NEURO: Normal gait.  PSYCH: Normally interactive. Conversant. Not depressed or anxious appearing.  Loud and argumentative demeanor. Very manipulative.     Assessment and Plan: Polysubstance abuse history Refused antidepressants and non benzodiazepine medication Referred back to psychiatrist.   Signed,  Phillips Odor, MD

## 2014-07-31 ENCOUNTER — Other Ambulatory Visit: Payer: Self-pay | Admitting: Neurology

## 2014-09-25 ENCOUNTER — Ambulatory Visit (INDEPENDENT_AMBULATORY_CARE_PROVIDER_SITE_OTHER): Payer: Medicare Other | Admitting: Adult Health

## 2014-09-25 ENCOUNTER — Encounter: Payer: Self-pay | Admitting: Adult Health

## 2014-09-25 VITALS — BP 120/79 | HR 98 | Ht 68.5 in | Wt 196.0 lb

## 2014-09-25 DIAGNOSIS — G40319 Generalized idiopathic epilepsy and epileptic syndromes, intractable, without status epilepticus: Secondary | ICD-10-CM

## 2014-09-25 DIAGNOSIS — G40311 Generalized idiopathic epilepsy and epileptic syndromes, intractable, with status epilepticus: Secondary | ICD-10-CM

## 2014-09-25 MED ORDER — LEVETIRACETAM ER 500 MG PO TB24: ORAL_TABLET | ORAL | Status: DC
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
## 2014-09-25 NOTE — Patient Instructions (Signed)
Seizure, Adult A seizure means there is unusual activity in the brain. A seizure can cause changes in attention or behavior. Seizures often cause shaking (convulsions). Seizures often last from 30 seconds to 2 minutes. HOME CARE   If you are given medicines, take them exactly as told by your doctor.  Keep all doctor visits as told.  Do not swim or drive until your doctor says it is okay.  Teach others what to do if you have a seizure. They should:  Lay you on the ground.  Put a cushion under your head.  Loosen any tight clothing around your neck.  Turn you on your side.  Stay with you until you get better. GET HELP RIGHT AWAY IF:   The seizure lasts longer than 2 to 5 minutes.  The seizure is very bad.  The person does not wake up after the seizure.  The person's attention or behavior changes. Drive the person to the emergency room or call your local emergency services (911 in [place] MAKE SURE YOU:   Understand these instructions.  Will watch your condition.  Will get help right away if you are not doing well or get worse. Document Released: 04/25/2008 Document Revised: 01/30/2012 Document Reviewed: 10/26/2011 ExitCare Patient Information 2015 [employer]. This information is not intended to replace advice given to you by your health care provider. Make sure you discuss any questions you have with your health care provider.  

## 2014-09-30 NOTE — Progress Notes (Signed)
I agree above plan. 

## 2014-10-09 ENCOUNTER — Other Ambulatory Visit: Payer: Self-pay | Admitting: Neurology

## 2015-04-21 ENCOUNTER — Telehealth: Payer: Self-pay | Admitting: Neurology

## 2015-04-21 NOTE — Telephone Encounter (Signed)
Patient father called requesting to follow up on the refill for KEPPRA. He states that the patient is out of the medication so he wants to make sure this is taken care of today. Please call and advise.

## 2015-04-21 NOTE — Telephone Encounter (Signed)
Father called stating he wanted to thank everyone that was involved with helping to get the RX approved. He was very grateful!

## 2015-04-21 NOTE — Telephone Encounter (Signed)
[facility] with approval for Rx levetiracetam 500 mg.  Approved for 1 year beginning Apr 21, 2015.  Thanks!

## 2015-04-21 NOTE — Telephone Encounter (Signed)
[employer] has apprpved the request for coverage on Levetiracetam ER effective until 04/21/2015 Ref #  [id number]

## 2015-04-21 NOTE — Telephone Encounter (Signed)
We have not gotten anything from the pharmacy regarding PA.  I called them to verify prescription coverage.  I then contacted ins and provided clinical info.  Request is currently under review, marked Urgent, Ref Key: [id number].  I called back to advise.  They are aware.

## 2015-04-21 NOTE — Telephone Encounter (Signed)
[name], pt's father called stating that the refill for levETIRAcetam (KEPPRA XR) 500 MG 24 hr tablet has been sent to his pharmacy but states that the patient's insurance, [employer] is not covering the cost. Please call and advise. Bethann BerkshireJohnny can be reached @ [phone]

## 2015-09-28 ENCOUNTER — Ambulatory Visit: Payer: Medicare Other | Admitting: Nurse Practitioner

## 2015-10-05 ENCOUNTER — Ambulatory Visit (INDEPENDENT_AMBULATORY_CARE_PROVIDER_SITE_OTHER): Payer: Medicare Other | Admitting: Nurse Practitioner

## 2015-10-05 ENCOUNTER — Telehealth: Payer: Self-pay | Admitting: Nurse Practitioner

## 2015-10-05 ENCOUNTER — Encounter: Payer: Self-pay | Admitting: Nurse Practitioner

## 2015-10-05 VITALS — BP 130/87 | HR 108 | Ht 68.0 in | Wt 194.0 lb

## 2015-10-05 DIAGNOSIS — G40319 Generalized idiopathic epilepsy and epileptic syndromes, intractable, without status epilepticus: Secondary | ICD-10-CM | POA: Diagnosis not present

## 2015-10-05 DIAGNOSIS — R569 Unspecified convulsions: Secondary | ICD-10-CM

## 2015-10-05 DIAGNOSIS — S0990XD Unspecified injury of head, subsequent encounter: Secondary | ICD-10-CM

## 2015-10-05 MED ORDER — LEVETIRACETAM ER 500 MG PO TB24: 1500.0000 mg | ORAL_TABLET | Freq: Every day | ORAL | Status: DC

## 2015-10-05 NOTE — Patient Instructions (Signed)
Continue Keppra extended release 500 mg 3 capsules daily will refill Call for any seizure activity Follow-up yearly and when necessary

## 2015-10-05 NOTE — Telephone Encounter (Signed)
I spoke to pt and he is moving in a home behind his parents, and is needing to know a closer location then [place], relating to his refilling of suboxone (whom he now get from Dr. Nedra HaiLee, [facility]).  I told him that would call their office and get a referral/transfer of care to another provider who prescribes this medication.  He verbalized understanding and would do this.
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
## 2015-10-05 NOTE — Telephone Encounter (Signed)
Patient is calling and would like to discuss how his medication Rx levetiracetam 500 mg 24 hr tablets interact with his other medications.  Please call.  Thanks!

## 2015-10-05 NOTE — Telephone Encounter (Signed)
Patient is calling

## 2015-10-05 NOTE — Progress Notes (Signed)
I have reviewed and agreed above plan. 

## 2015-10-05 NOTE — Telephone Encounter (Signed)
I called and LMVM for pt with his mother to have him return call.

## 2015-11-05 ENCOUNTER — Other Ambulatory Visit: Payer: Self-pay | Admitting: Adult Health

## 2015-12-13 ENCOUNTER — Other Ambulatory Visit: Payer: Self-pay | Admitting: Adult Health

## 2016-06-20 ENCOUNTER — Telehealth: Payer: Self-pay | Admitting: Nurse Practitioner

## 2016-06-20 NOTE — Telephone Encounter (Signed)
Pt needs PA on levETIRAcetam (KEPPRA XR) 500 MG 24 hr tablet.  May call pt to update on PA - [phone]

## 2016-06-20 NOTE — Telephone Encounter (Signed)
Spoke to pt.  His PA ran out and needs new one.  He ususally pays $3 and now $700.  I will initiate.  No change in insurance.

## 2016-06-22 NOTE — Telephone Encounter (Signed)
Attempted to get PA for generic keppra over the phone and was told they do not do that anymore.  Placed urgent request on cover my meds to get PA for generic keppra for this pt.  I spoke to [place] with [facility] [address] And they are able to give 9 tablets to pt for $34.97 and when receive approval they they will apply this to his cost of the original refill.

## 2016-06-22 NOTE — Telephone Encounter (Signed)
Patient called back regarding prior authorization for Keppra, states he is out of this medication. Please call to advise if PA has been done.

## 2016-06-22 NOTE — Telephone Encounter (Signed)
I spoke to pt and relayed that have not heard back from insurance about PA.  I told him that did under an urgent 24 hour time frame.  [facility] and I let him know that he can pick up 9 tablets for $35.00 and this would be applied to his refills, once approved.  Pt verbalized understanding of this.

## 2016-06-24 NOTE — Telephone Encounter (Signed)
Spoke to [place] at [address] that PA approval for levetiracetam ER .  I LMVM for pt on his mobile that PA approval given and did go thru at pharmacy.

## 2016-06-24 NOTE — Telephone Encounter (Signed)
PA approved for levetirecetam ER 500mg  tabs 06-22-16 thru 06-22-2017.  ID # [id number]  [id number].  [employer] [place] part D.

## 2016-07-04 ENCOUNTER — Telehealth: Payer: Self-pay | Admitting: *Deleted

## 2016-07-04 NOTE — Telephone Encounter (Signed)
Received levetiracetam ER 500mg  PA letter. Approval thru 06-22-2017.  [employer]  ID [id number].  [phone].

## 2016-09-19 ENCOUNTER — Other Ambulatory Visit: Payer: Self-pay | Admitting: Adult Health

## 2016-10-04 ENCOUNTER — Ambulatory Visit (INDEPENDENT_AMBULATORY_CARE_PROVIDER_SITE_OTHER): Payer: Medicare Other | Admitting: Nurse Practitioner

## 2016-10-04 ENCOUNTER — Encounter: Payer: Self-pay | Admitting: Nurse Practitioner

## 2016-10-04 VITALS — BP 124/79 | HR 91 | Ht 68.0 in | Wt 198.4 lb

## 2016-10-04 DIAGNOSIS — G40319 Generalized idiopathic epilepsy and epileptic syndromes, intractable, without status epilepticus: Secondary | ICD-10-CM

## 2016-10-04 DIAGNOSIS — R569 Unspecified convulsions: Secondary | ICD-10-CM

## 2016-10-04 MED ORDER — LEVETIRACETAM ER 500 MG PO TB24: 1500.0000 mg | ORAL_TABLET | Freq: Every day | ORAL | 3 refills | Status: DC

## 2016-10-04 NOTE — Progress Notes (Signed)
I have reviewed and agreed above plan. 

## 2016-10-04 NOTE — Patient Instructions (Signed)
Continue Keppra extended release 500 mg 3 capsules daily will refill Call for any seizure activity Follow-up yearly and when necessary 
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
## 2017-09-28 ENCOUNTER — Encounter: Payer: Self-pay | Admitting: Nurse Practitioner

## 2017-09-28 ENCOUNTER — Ambulatory Visit: Payer: Medicare Other | Admitting: Nurse Practitioner

## 2017-09-28 VITALS — BP 130/83 | HR 90 | Wt 203.0 lb

## 2017-09-28 DIAGNOSIS — G4700 Insomnia, unspecified: Secondary | ICD-10-CM

## 2017-09-28 DIAGNOSIS — G40319 Generalized idiopathic epilepsy and epileptic syndromes, intractable, without status epilepticus: Secondary | ICD-10-CM | POA: Diagnosis not present

## 2017-09-28 NOTE — Patient Instructions (Addendum)
Continue Keppra extended release 500 mg 3 capsules daily will refill Call for any seizure activity Try melatonin 3 mg over-the-counter for insomnia Follow-up yearly and when necessary Insomnia Insomnia is a sleep disorder that makes it difficult to fall asleep or to stay asleep. Insomnia can cause tiredness (fatigue), low energy, difficulty concentrating, mood swings, and poor performance at work or school. There are three different ways to classify insomnia:  Difficulty falling asleep.  Difficulty staying asleep.  Waking up too early in the morning.  Any type of insomnia can be long-term (chronic) or short-term (acute). Both are common. Short-term insomnia usually lasts for three months or less. Chronic insomnia occurs at least three times a week for longer than three months. What are the causes? Insomnia may be caused by another condition, situation, or substance, such as:  Anxiety.  Certain medicines.  Gastroesophageal reflux disease (GERD) or other gastrointestinal conditions.  Asthma or other breathing conditions.  Restless legs syndrome, sleep apnea, or other sleep disorders.  Chronic pain.  Menopause. This may include hot flashes.  Stroke.  Abuse of alcohol, tobacco, or illegal drugs.  Depression.  Caffeine.  Neurological disorders, such as Alzheimer disease.  An overactive thyroid (hyperthyroidism).  The cause of insomnia may not be known. What increases the risk? Risk factors for insomnia include:  Gender. Women are more commonly affected than men.  Age. Insomnia is more common as you get older.  Stress. This may involve your professional or personal life.  Income. Insomnia is more common in people with lower income.  Lack of exercise.  Irregular work schedule or night shifts.  Traveling between different time zones.  What are the signs or symptoms? If you have insomnia, trouble falling asleep or trouble staying asleep is the main symptom. This  may lead to other symptoms, such as:  Feeling fatigued.  Feeling nervous about going to sleep.  Not feeling rested in the morning.  Having trouble concentrating.  Feeling irritable, anxious, or depressed.  How is this treated? Treatment for insomnia depends on the cause. If your insomnia is caused by an underlying condition, treatment will focus on addressing the condition. Treatment may also include:  Medicines to help you sleep.  Counseling or therapy.  Lifestyle adjustments.  Follow these instructions at home:  Take medicines only as directed by your health care provider.  Keep regular sleeping and waking hours. Avoid naps.  Keep a sleep diary to help you and your health care provider figure out what could be causing your insomnia. Include: ? When you sleep. ? When you wake up during the night. ? How well you sleep. ? How rested you feel the next day. ? Any side effects of medicines you are taking. ? What you eat and drink.  Make your bedroom a comfortable place where it is easy to fall asleep: ? Put up shades or special blackout curtains to block light from outside. ? Use a white noise machine to block noise. ? Keep the temperature cool.  Exercise regularly as directed by your health care provider. Avoid exercising right before bedtime.  Use relaxation techniques to manage stress. Ask your health care provider to suggest some techniques that may work well for you. These may include: ? Breathing exercises. ? Routines to release muscle tension. ? Visualizing peaceful scenes.  Cut back on alcohol, caffeinated beverages, and cigarettes, especially close to bedtime. These can disrupt your sleep.  Do not overeat or eat spicy foods right before bedtime. This can lead to  digestive discomfort that can make it hard for you to sleep.  Limit screen use before bedtime. This includes: ? Watching TV. ? Using your smartphone, tablet, and computer.  Stick to a routine. This  can help you fall asleep faster. Try to do a quiet activity, brush your teeth, and go to bed at the same time each night.  Get out of bed if you are still awake after 15 minutes of trying to sleep. Keep the lights down, but try reading or doing a quiet activity. When you feel sleepy, go back to bed.  Make sure that you drive carefully. Avoid driving if you feel very sleepy.  Keep all follow-up appointments as directed by your health care provider. This is important. Contact a health care provider if:  You are tired throughout the day or have trouble in your daily routine due to sleepiness.  You continue to have sleep problems or your sleep problems get worse. Get help right away if:  You have serious thoughts about hurting yourself or someone else. This information is not intended to replace advice given to you by your health care provider. Make sure you discuss any questions you have with your health care provider. Document Released: 11/04/2000 Document Revised: 04/08/2016 Document Reviewed: 08/08/2014 Elsevier Interactive Patient Education  [employer]2018 [employer]vier Inc.

## 2017-09-28 NOTE — Progress Notes (Signed)
I have reviewed and agreed above plan. 

## 2017-10-04 ENCOUNTER — Ambulatory Visit: Payer: Medicare Other | Admitting: Nurse Practitioner

## 2018-02-19 ENCOUNTER — Telehealth: Payer: Self-pay | Admitting: Nurse Practitioner

## 2018-02-19 ENCOUNTER — Encounter: Payer: Self-pay | Admitting: Nurse Practitioner

## 2018-02-19 ENCOUNTER — Telehealth: Payer: Self-pay | Admitting: Neurology

## 2018-02-19 MED ORDER — LEVETIRACETAM ER 500 MG PO TB24: 1500.0000 mg | ORAL_TABLET | Freq: Every day | ORAL | 2 refills | Status: DC

## 2018-02-19 NOTE — Telephone Encounter (Signed)
Refill sent to pharmacy.   

## 2018-02-19 NOTE — Telephone Encounter (Signed)
Pt requesting a refill for levETIRAcetam (KEPPRA XR) 500 MG 24 hr tablet sent to [facility] on [address]

## 2018-02-23 ENCOUNTER — Other Ambulatory Visit: Payer: Self-pay | Admitting: *Deleted

## 2018-02-23 NOTE — Telephone Encounter (Signed)
Received fax that keppra xr backordered until end of month.  She had a 30 day supply available for pt and would call him.  They should hopefully have in stock for his next 3 month supply.

## 2018-02-26 NOTE — Telephone Encounter (Signed)
Tetecia with [employer] calling to advise levETIRAcetam (KEPPRA XR) 500 MG 24 hr tablet has been approved from 02-26-18 to 02-27-19.  If needed Jonn Shinglesetecia can be reached at [phone] Option 5.

## 2018-02-26 NOTE — Telephone Encounter (Signed)
Faxed approval information to pts [facility].

## 2018-05-15 DIAGNOSIS — L309 Dermatitis, unspecified: Secondary | ICD-10-CM | POA: Diagnosis not present

## 2018-05-15 DIAGNOSIS — Z6828 Body mass index (BMI) 28.0-28.9, adult: Secondary | ICD-10-CM | POA: Diagnosis not present

## 2018-05-15 DIAGNOSIS — E669 Obesity, unspecified: Secondary | ICD-10-CM | POA: Diagnosis not present

## 2018-05-15 DIAGNOSIS — Z1331 Encounter for screening for depression: Secondary | ICD-10-CM | POA: Diagnosis not present
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
## 2018-08-24 ENCOUNTER — Encounter: Payer: Self-pay | Admitting: Nurse Practitioner

## 2018-08-24 ENCOUNTER — Ambulatory Visit: Payer: Medicare Other | Admitting: Nurse Practitioner

## 2018-08-24 VITALS — BP 107/73 | HR 78 | Ht 68.0 in | Wt 173.2 lb

## 2018-08-24 DIAGNOSIS — G4700 Insomnia, unspecified: Secondary | ICD-10-CM | POA: Diagnosis not present

## 2018-08-24 DIAGNOSIS — G40319 Generalized idiopathic epilepsy and epileptic syndromes, intractable, without status epilepticus: Secondary | ICD-10-CM | POA: Diagnosis not present

## 2018-08-24 MED ORDER — LEVETIRACETAM ER 500 MG PO TB24: 1500.0000 mg | ORAL_TABLET | Freq: Every day | ORAL | 3 refills | Status: DC

## 2018-08-24 NOTE — Patient Instructions (Signed)
Continue Keppra extended release 500 mg 3 capsules daily will refill for 1 year Call for any seizure activity Follow-up yearly and when necessary

## 2018-08-29 NOTE — Progress Notes (Signed)
I have reviewed and agreed above plan. 

## 2018-10-01 ENCOUNTER — Ambulatory Visit: Payer: Medicare Other | Admitting: Nurse Practitioner

## 2018-10-08 ENCOUNTER — Ambulatory Visit: Payer: Medicare Other | Admitting: Nurse Practitioner

## 2019-04-29 ENCOUNTER — Telehealth: Payer: Self-pay | Admitting: Neurology

## 2019-04-29 NOTE — Telephone Encounter (Signed)
Pt called in and stated the [facility] ([facility]) is saying he will have pay 1,000$ for his levETIRAcetam (KEPPRA XR) 500 MG 24 hr tablet he is wanting to know if a PA can be done

## 2019-04-30 NOTE — Telephone Encounter (Signed)
Spoke to pt and relayed that will initiate PA for levetiracetam 1500mg  ER daily. (500mg  ER tabs).    CMM KEY [id number].  Urgent request as pt out of medication.  Same [employer] except GRP [id number].  G40.319.  Has taken depakote, lamictal in past.

## 2019-04-30 NOTE — Telephone Encounter (Signed)
Received approval for nonformulary exception for levetiracetam er 500mg  tabls thru 04-29-2020.  [employer] [phone].  Fax confirmation received to [place]. [phone].

## 2019-04-30 NOTE — Telephone Encounter (Signed)
LM with pts mother.  She will relay that approval given for levetiracetam and [facility] [address]. She will let him know.

## 2019-08-26 ENCOUNTER — Other Ambulatory Visit: Payer: Self-pay

## 2019-08-26 ENCOUNTER — Ambulatory Visit: Payer: Medicare Other | Admitting: Neurology

## 2019-08-26 ENCOUNTER — Encounter: Payer: Self-pay | Admitting: Neurology

## 2019-08-26 VITALS — BP 127/71 | HR 78 | Temp 97.8°F | Ht 68.0 in | Wt 193.0 lb

## 2019-08-26 DIAGNOSIS — G40319 Generalized idiopathic epilepsy and epileptic syndromes, intractable, without status epilepticus: Secondary | ICD-10-CM

## 2019-08-26 MED ORDER — LEVETIRACETAM ER 500 MG PO TB24: 1500.0000 mg | ORAL_TABLET | Freq: Every day | ORAL | 4 refills | Status: DC
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
## 2020-08-24 ENCOUNTER — Telehealth: Payer: Self-pay | Admitting: Neurology

## 2020-08-24 ENCOUNTER — Encounter: Payer: Self-pay | Admitting: *Deleted

## 2020-08-24 NOTE — Telephone Encounter (Signed)
Letter started in communications.  Change date then print.  Maybe edited if needed.
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
## 2020-08-24 NOTE — Telephone Encounter (Signed)
I spoke to the patient. He has an appt with Margie Ege, NP on 08/25/20. He wanted to let her know ahead of time that he will need this letter for his employer.

## 2020-08-24 NOTE — Telephone Encounter (Signed)
Pt called stating that he is needing a letter from the provider for work stating how long he has been treated and what medications he is on. Please advise.

## 2020-08-25 ENCOUNTER — Encounter: Payer: Self-pay | Admitting: Neurology

## 2020-08-25 ENCOUNTER — Ambulatory Visit: Payer: Medicare Other | Admitting: Neurology

## 2020-08-25 ENCOUNTER — Telehealth: Payer: Self-pay | Admitting: Neurology

## 2020-08-25 VITALS — BP 130/86 | HR 86 | Ht 68.0 in | Wt 186.0 lb

## 2020-08-25 DIAGNOSIS — G40319 Generalized idiopathic epilepsy and epileptic syndromes, intractable, without status epilepticus: Secondary | ICD-10-CM

## 2020-08-25 MED ORDER — LEVETIRACETAM ER 500 MG PO TB24: 1500.0000 mg | ORAL_TABLET | Freq: Every day | ORAL | 4 refills | Status: DC

## 2020-08-25 NOTE — Telephone Encounter (Signed)
Pt called again stating that he would like this expedited due to him running low on his medication. Please advise.

## 2020-08-25 NOTE — Telephone Encounter (Signed)
[employer] of [place] [facility]) called, notifying physician levETIRAcetam (KEPPRA XR) 500 MG 24 hr tablet has been approved for 1 year starting today.

## 2020-08-25 NOTE — Patient Instructions (Addendum)
Continue with Keppra at current dosing Take it everyday at prescribed dosing No driving until seizure free for 6 months  See you back in 6 months

## 2020-08-25 NOTE — Telephone Encounter (Signed)
Patient called stating that [facility] that [facility] is needing a prior authorization. Patient is worried because he only has a few left.
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
## 2021-02-23 ENCOUNTER — Ambulatory Visit: Payer: Medicare Other | Admitting: Neurology

## 2021-02-23 ENCOUNTER — Encounter: Payer: Self-pay | Admitting: Neurology

## 2021-02-23 VITALS — BP 150/92 | HR 101 | Ht 68.0 in | Wt 200.0 lb

## 2021-02-23 DIAGNOSIS — G40319 Generalized idiopathic epilepsy and epileptic syndromes, intractable, without status epilepticus: Secondary | ICD-10-CM | POA: Diagnosis not present

## 2021-02-23 MED ORDER — LEVETIRACETAM ER 500 MG PO TB24: 1500.0000 mg | ORAL_TABLET | Freq: Every day | ORAL | 4 refills | Status: DC

## 2021-02-23 NOTE — Patient Instructions (Signed)
Continue Keppra at current dosing  Make sure taking medication daily Call for seizure activity  See you back in 1 year

## 2021-08-03 ENCOUNTER — Ambulatory Visit: Payer: Self-pay | Admitting: *Deleted

## 2021-08-03 ENCOUNTER — Encounter (HOSPITAL_COMMUNITY): Payer: Self-pay | Admitting: Emergency Medicine

## 2021-08-03 ENCOUNTER — Other Ambulatory Visit: Payer: Self-pay

## 2021-08-03 ENCOUNTER — Ambulatory Visit (INDEPENDENT_AMBULATORY_CARE_PROVIDER_SITE_OTHER)
Admission: EM | Admit: 2021-08-03 | Discharge: 2021-08-03 | Disposition: A | Discharge status: Other Acute Inpatient Hospital | Payer: Medicare Other | Source: Home / Self Care

## 2021-08-03 ENCOUNTER — Inpatient Hospital Stay (HOSPITAL_COMMUNITY)
Admission: EM | Admit: 2021-08-03 | Discharge: 2021-08-06 | DRG: 897 | Disposition: A | Discharge status: Home / Self Care | Payer: Medicare Other | Source: Ambulatory Visit | Attending: Internal Medicine | Admitting: Internal Medicine

## 2021-08-03 ENCOUNTER — Emergency Department (HOSPITAL_COMMUNITY): Payer: Medicare Other

## 2021-08-03 DIAGNOSIS — Z885 Allergy status to narcotic agent status: Secondary | ICD-10-CM

## 2021-08-03 DIAGNOSIS — D72829 Elevated white blood cell count, unspecified: Secondary | ICD-10-CM | POA: Diagnosis present

## 2021-08-03 DIAGNOSIS — I10 Essential (primary) hypertension: Secondary | ICD-10-CM | POA: Diagnosis present

## 2021-08-03 DIAGNOSIS — Z8659 Personal history of other mental and behavioral disorders: Secondary | ICD-10-CM

## 2021-08-03 DIAGNOSIS — F10239 Alcohol dependence with withdrawal, unspecified: Principal | ICD-10-CM | POA: Diagnosis present

## 2021-08-03 DIAGNOSIS — R410 Disorientation, unspecified: Secondary | ICD-10-CM

## 2021-08-03 DIAGNOSIS — F17210 Nicotine dependence, cigarettes, uncomplicated: Secondary | ICD-10-CM | POA: Diagnosis present

## 2021-08-03 DIAGNOSIS — Z87820 Personal history of traumatic brain injury: Secondary | ICD-10-CM

## 2021-08-03 DIAGNOSIS — R569 Unspecified convulsions: Secondary | ICD-10-CM | POA: Diagnosis not present

## 2021-08-03 DIAGNOSIS — Z20822 Contact with and (suspected) exposure to covid-19: Secondary | ICD-10-CM | POA: Diagnosis present

## 2021-08-03 DIAGNOSIS — G4700 Insomnia, unspecified: Secondary | ICD-10-CM | POA: Diagnosis present

## 2021-08-03 DIAGNOSIS — R462 Strange and inexplicable behavior: Secondary | ICD-10-CM

## 2021-08-03 DIAGNOSIS — F32A Depression, unspecified: Secondary | ICD-10-CM | POA: Diagnosis present

## 2021-08-03 DIAGNOSIS — G40909 Epilepsy, unspecified, not intractable, without status epilepticus: Secondary | ICD-10-CM

## 2021-08-03 DIAGNOSIS — R4182 Altered mental status, unspecified: Secondary | ICD-10-CM | POA: Diagnosis present

## 2021-08-03 DIAGNOSIS — Z7289 Other problems related to lifestyle: Secondary | ICD-10-CM

## 2021-08-03 DIAGNOSIS — G8929 Other chronic pain: Secondary | ICD-10-CM | POA: Diagnosis present

## 2021-08-03 DIAGNOSIS — Z789 Other specified health status: Secondary | ICD-10-CM

## 2021-08-03 DIAGNOSIS — F1090 Alcohol use, unspecified, uncomplicated: Secondary | ICD-10-CM

## 2021-08-03 DIAGNOSIS — R42 Dizziness and giddiness: Secondary | ICD-10-CM | POA: Insufficient documentation

## 2021-08-03 DIAGNOSIS — M549 Dorsalgia, unspecified: Secondary | ICD-10-CM | POA: Diagnosis present

## 2021-08-03 DIAGNOSIS — Z833 Family history of diabetes mellitus: Secondary | ICD-10-CM

## 2021-08-03 DIAGNOSIS — G40209 Localization-related (focal) (partial) symptomatic epilepsy and epileptic syndromes with complex partial seizures, not intractable, without status epilepticus: Secondary | ICD-10-CM | POA: Diagnosis present

## 2021-08-03 LAB — CBC WITH DIFFERENTIAL/PLATELET
Abs Immature Granulocytes: 0.07 10*3/uL (ref 0.00–0.07)
Basophils Absolute: 0.1 10*3/uL (ref 0.0–0.1)
Basophils Relative: 1 %
Eosinophils Absolute: 0.2 10*3/uL (ref 0.0–0.5)
Eosinophils Relative: 1 %
HCT: 47.7 % (ref 39.0–52.0)
Hemoglobin: 16.2 g/dL (ref 13.0–17.0)
Immature Granulocytes: 0 %
Lymphocytes Relative: 20 %
Lymphs Abs: 3.4 10*3/uL (ref 0.7–4.0)
MCH: 31.1 pg (ref 26.0–34.0)
MCHC: 34 g/dL (ref 30.0–36.0)
MCV: 91.6 fL (ref 80.0–100.0)
Monocytes Absolute: 1.2 10*3/uL — ABNORMAL HIGH (ref 0.1–1.0)
Monocytes Relative: 7 %
Neutro Abs: 11.9 10*3/uL — ABNORMAL HIGH (ref 1.7–7.7)
Neutrophils Relative %: 71 %
Platelets: 301 10*3/uL (ref 150–400)
RBC: 5.21 MIL/uL (ref 4.22–5.81)
RDW: 13 % (ref 11.5–15.5)
WBC: 16.8 10*3/uL — ABNORMAL HIGH (ref 4.0–10.5)
nRBC: 0 % (ref 0.0–0.2)

## 2021-08-03 LAB — COMPREHENSIVE METABOLIC PANEL
ALT: 41 U/L (ref 0–44)
AST: 25 U/L (ref 15–41)
Albumin: 5 g/dL (ref 3.5–5.0)
Alkaline Phosphatase: 63 U/L (ref 38–126)
Anion gap: 13 (ref 5–15)
BUN: 18 mg/dL (ref 6–20)
CO2: 22 mmol/L (ref 22–32)
Calcium: 9.7 mg/dL (ref 8.9–10.3)
Chloride: 104 mmol/L (ref 98–111)
Creatinine, Ser: 1.02 mg/dL (ref 0.61–1.24)
GFR, Estimated: >60 mL/min (ref >60)
Glucose, Bld: 103 mg/dL — ABNORMAL HIGH (ref 70–99)
Potassium: 3.8 mmol/L (ref 3.5–5.1)
Sodium: 139 mmol/L (ref 135–145)
Total Bilirubin: 0.6 mg/dL (ref 0.3–1.2)
Total Protein: 8.1 g/dL (ref 6.5–8.1)

## 2021-08-03 LAB — URINALYSIS, ROUTINE W REFLEX MICROSCOPIC
Bacteria, UA: NONE SEEN
Bilirubin Urine: NEGATIVE
Glucose, UA: NEGATIVE mg/dL
Hgb urine dipstick: NEGATIVE
Ketones, ur: 5 mg/dL — AB
Leukocytes,Ua: NEGATIVE
Nitrite: NEGATIVE
Protein, ur: 100 mg/dL — AB
Specific Gravity, Urine: 1.024 (ref 1.005–1.030)
pH: 5 (ref 5.0–8.0)

## 2021-08-03 LAB — MAGNESIUM: Magnesium: 2.4 mg/dL (ref 1.7–2.4)

## 2021-08-03 LAB — RAPID URINE DRUG SCREEN, HOSP PERFORMED
Amphetamines: NOT DETECTED
Barbiturates: NOT DETECTED
Benzodiazepines: NOT DETECTED
Cocaine: NOT DETECTED
Opiates: NOT DETECTED
Tetrahydrocannabinol: NOT DETECTED

## 2021-08-03 LAB — RESP PANEL BY RT-PCR (FLU A&B, COVID) ARPGX2
Influenza A by PCR: NEGATIVE
Influenza B by PCR: NEGATIVE
SARS Coronavirus 2 by RT PCR: NEGATIVE

## 2021-08-03 LAB — PROTIME-INR
INR: 1 (ref 0.8–1.2)
Prothrombin Time: 13.4 seconds (ref 11.4–15.2)

## 2021-08-03 LAB — OSMOLALITY: Osmolality: 293 mOsm/kg (ref 275–295)

## 2021-08-03 LAB — SALICYLATE LEVEL: Salicylate Lvl: <7 mg/dL — ABNORMAL LOW (ref 7.0–30.0)

## 2021-08-03 LAB — HIV ANTIBODY (ROUTINE TESTING W REFLEX): HIV Screen 4th Generation wRfx: NONREACTIVE

## 2021-08-03 LAB — TSH: TSH: 0.522 u[IU]/mL (ref 0.350–4.500)

## 2021-08-03 LAB — CBG MONITORING, ED: Glucose-Capillary: 161 mg/dL — ABNORMAL HIGH (ref 70–99)

## 2021-08-03 LAB — ETHANOL: Alcohol, Ethyl (B): <10 mg/dL (ref <10)

## 2021-08-03 LAB — ACETAMINOPHEN LEVEL: Acetaminophen (Tylenol), Serum: <10 ug/mL — ABNORMAL LOW (ref 10–30)

## 2021-08-03 MED ORDER — SODIUM CHLORIDE 0.9 % IV SOLN
75.0000 mL/h | INTRAVENOUS | Status: DC
  Administered 2021-08-03 – 2021-08-05 (×4): 75 mL/h via INTRAVENOUS

## 2021-08-03 MED ORDER — THIAMINE HCL 100 MG/ML IJ SOLN
100.0000 mg | Freq: Once | INTRAMUSCULAR | Status: AC
  Administered 2021-08-03: 100 mg via INTRAVENOUS
  Filled 2021-08-03: qty 2

## 2021-08-03 MED ORDER — THIAMINE HCL 100 MG/ML IJ SOLN
100.0000 mg | Freq: Every day | INTRAMUSCULAR | Status: DC
  Filled 2021-08-03: qty 2

## 2021-08-03 MED ORDER — LORAZEPAM 1 MG PO TABS
1.0000 mg | ORAL_TABLET | ORAL | Status: DC | PRN
  Administered 2021-08-03 (×2): 2 mg via ORAL
  Administered 2021-08-04 (×2): 3 mg via ORAL
  Administered 2021-08-05: 1 mg via ORAL
  Administered 2021-08-05: 3 mg via ORAL
  Filled 2021-08-03 (×3): qty 2
  Filled 2021-08-03 (×3): qty 3

## 2021-08-03 MED ORDER — ACETAMINOPHEN 325 MG PO TABS: 650.0000 mg | ORAL_TABLET | ORAL | Status: DC | PRN

## 2021-08-03 MED ORDER — FAMOTIDINE 20 MG PO TABS
20.0000 mg | ORAL_TABLET | Freq: Two times a day (BID) | ORAL | Status: DC
  Administered 2021-08-03 – 2021-08-06 (×6): 20 mg via ORAL
  Filled 2021-08-03 (×6): qty 1

## 2021-08-03 MED ORDER — ORAL CARE MOUTH RINSE
15.0000 mL | OROMUCOSAL | Status: DC
  Administered 2021-08-03 – 2021-08-06 (×5): 15 mL via OROMUCOSAL

## 2021-08-03 MED ORDER — LORAZEPAM 2 MG/ML IJ SOLN
2.0000 mg | Freq: Once | INTRAMUSCULAR | Status: AC
  Administered 2021-08-03: 2 mg via INTRAVENOUS
  Filled 2021-08-03: qty 1

## 2021-08-03 MED ORDER — SODIUM CHLORIDE 0.9 % IV BOLUS
1000.0000 mL | Freq: Once | INTRAVENOUS | Status: AC
  Administered 2021-08-03: 1000 mL via INTRAVENOUS

## 2021-08-03 MED ORDER — ADULT MULTIVITAMIN W/MINERALS CH
1.0000 | ORAL_TABLET | Freq: Every day | ORAL | Status: DC
  Administered 2021-08-03 – 2021-08-06 (×4): 1 via ORAL
  Filled 2021-08-03 (×3): qty 1

## 2021-08-03 MED ORDER — THIAMINE HCL 100 MG PO TABS
100.0000 mg | ORAL_TABLET | Freq: Every day | ORAL | Status: DC
  Administered 2021-08-03: 100 mg via ORAL
  Filled 2021-08-03: qty 1

## 2021-08-03 MED ORDER — LORAZEPAM 2 MG/ML IJ SOLN
1.0000 mg | INTRAMUSCULAR | Status: DC | PRN
Start: 2021-08-03 — End: 2021-08-06
  Administered 2021-08-04: 3 mg via INTRAVENOUS
  Filled 2021-08-03: qty 2

## 2021-08-03 MED ORDER — ACETAMINOPHEN 650 MG RE SUPP: 650.0000 mg | RECTAL | Status: DC | PRN

## 2021-08-03 MED ORDER — FOLIC ACID 1 MG PO TABS
1.0000 mg | ORAL_TABLET | Freq: Every day | ORAL | Status: DC
  Administered 2021-08-03 – 2021-08-06 (×4): 1 mg via ORAL
  Filled 2021-08-03 (×4): qty 1

## 2021-08-03 MED ORDER — ENOXAPARIN SODIUM 40 MG/0.4ML IJ SOSY
40.0000 mg | PREFILLED_SYRINGE | INTRAMUSCULAR | Status: DC
  Administered 2021-08-03 – 2021-08-04 (×2): 40 mg via SUBCUTANEOUS
  Filled 2021-08-03 (×3): qty 0.4

## 2021-08-03 MED ORDER — LEVETIRACETAM ER 500 MG PO TB24
1500.0000 mg | ORAL_TABLET | Freq: Every day | ORAL | Status: DC
  Administered 2021-08-04 – 2021-08-06 (×3): 1500 mg via ORAL
  Filled 2021-08-03 (×3): qty 3

## 2021-08-03 MED ORDER — CHLORHEXIDINE GLUCONATE 0.12% ORAL RINSE (MEDLINE KIT)
15.0000 mL | Freq: Two times a day (BID) | OROMUCOSAL | Status: DC
  Administered 2021-08-04: 15 mL via OROMUCOSAL

## 2021-08-03 MED ORDER — LEVETIRACETAM IN NACL 500 MG/100ML IV SOLN
500.0000 mg | Freq: Once | INTRAVENOUS | Status: AC
  Administered 2021-08-03: 500 mg via INTRAVENOUS
  Filled 2021-08-03: qty 100

## 2021-08-03 MED ORDER — SODIUM CHLORIDE 0.9 % IV SOLN: INTRAVENOUS | Status: DC
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
## 2021-08-03 NOTE — ED Provider Notes (Addendum)
Emergency Medicine Provider Triage Evaluation Note  [name] , a 50 y.o. male  was evaluated in triage.  Pt complains of ams. He was brought to [facility] by his father who reported pt has had ams for 4 days since returning from a solo trip from [place]. Has been hallucinating and has not been making sense during conversation.  Review of Systems  Positive: Ams, hallucinations Negative: Si, hi  Physical Exam  BP (!) 152/101 (BP Location: Left Arm)   Pulse 78   Temp 99 F (37.2 C) (Oral)   Resp 16   SpO2 95%  Gen:   Awake, no distress   Resp:  Normal effort  MSK:   Moves extremities without difficulty  Other:  Intermittently delayed in answering questions, oriented to self, place  Medical Decision Making  Medically screening exam initiated at 3:47 PM.  Appropriate orders placed.  [name] was informed that the remainder of the evaluation will be completed by another provider, this initial triage assessment does not replace that evaluation, and the importance of remaining in the ED until their evaluation is complete.  3:43 PM Pt had witnessed generalized tonic clonic seizure that lasted about 60 seconds while in triage. Pt was in the middle of being wanded by security who witnessed him fall to the ground and hit his head. Pt was observed to be tongue biting and had urinary incontinence.    Karrie Meres, PA-C 08/03/21 1514    Karrie Meres, PA-C 08/03/21 1547    Terrilee Files, MD 08/03/21 Jerene Bears

## 2021-08-03 NOTE — ED Triage Notes (Signed)
Pt here via EMS from [facility] d/t acting strangely X4 days. Pt having transient communication difficulties. Pt will be talking, then at a moment notice unable to speak. Family reports hallucinations. History of substance abuse, seizure and TBI. Recently returned from [place].

## 2021-08-03 NOTE — ED Notes (Signed)
This RN walked in to find patient had removed own IV. Patient does not remember doing so. This RN inserted new IV, patient tolerated well. Patient continuous to be confused, repeating the same statements and needing reorientation to time and situation.
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
## 2021-08-03 NOTE — Plan of Care (Signed)
Sounds like his words weren't making sense, not able to hold a conversation, bizarre behavior and some aggression and mention of hallucinations.  Other notes say pt returned from [place] on Friday. Given the concern for noncompliance in past with neuro, I will try to add on a keppra level to 3pm labs since 7pm is when keppra was given. Also doing tox screen. Will get permission to talk to father.  Pt agreed that I could speak openly with him, tried him x2 and left a generic message. Pt was mentioning that he didn't want to stay because he is supposed to fly back to his girlfriend in [place] tomorrow, but I emphasized the importance and risk of even up to death if he left, reminding him he had a seizure in the ED waiting room #still concern for etoh withdrawal, and etoh withdrawal seizure with hallucinations the past couple days
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
## 2021-08-03 NOTE — Progress Notes (Signed)
[name] was transported to [facility] ED for AMS via EMS. The necessary paperwork was sent with him.

## 2021-08-03 NOTE — ED Notes (Signed)
Please update pt father: Cell phone [phone]

## 2021-08-03 NOTE — Telephone Encounter (Signed)
Pt's father calling, pt not present, in apartment in household. States pt "Out of his mind." Reports aggressive  behavior this AM, agitated, shaking, banging on door. States returned from [place] Friday, "Not functioning since." "Can't do anything." Asked to speak to pt, states "No good, he'll say nothing wrong." Advised 911, declines. States does not feel threatened presently, "Just need him seen." Advised [facility], information provided. Also provided Behavioral Health number. Reiterated need for 911 if behavior worsens, feels threatened. Verbalizes understanding. No PCP. Reason for Disposition . Patient sounds very sick or weak to the triager  Protocols used: Difficult Call-A-AH

## 2021-08-03 NOTE — ED Provider Notes (Signed)
[facility] EMERGENCY DEPARTMENT Provider Note   CSN: [id number] Arrival date & time: 08/03/21  1504     History Chief complaint: Altered mental status  [name] is a 50 y.o. male.  HPI   Patient presented to the ED for evaluation of altered mental status.  Patient initially went to the behavioral health urgent care.  His father brought him in for evaluation.  According to the notes the patient has a history of bipolar disorder.  Patient recently returned from [place].  Since Friday the patient has had some abnormal behavior and speech.  Unknown if the patient has been using any drugs or alcohol.  Patient was noted to have delayed and slowed speech.  He was sent to the ED for further evaluation.  While in the waiting room here patient was noted to have a tonic-clonic seizure.  He did have urinary incontinence.  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     Family History  Problem Relation Age of Onset   Hypotension Mother    Diabetes Mother     Social History   Tobacco Use   Smoking status: Every Day    Packs/day: 0.25    Years: 1.00    Pack years: 0.25    Types: Cigarettes   Smokeless tobacco: Never   Tobacco comments:    09/28/17  5 cigs daily  Substance Use Topics   Alcohol use: No     Alcohol/week: 0.0 standard drinks    Comment: stopped 5 years ago   Drug use: No    Types: Benzodiazepines    Home Medications Prior to Admission medications   Medication Sig Start Date End Date Taking? Authorizing Provider  levETIRAcetam (KEPPRA XR) 500 MG 24 hr tablet Take 3 tablets (1,500 mg total) by mouth daily. 02/23/21  Yes Glean Salvo, NP  Multiple Vitamin (MULTIVITAMIN) tablet Take 1 tablet by mouth daily.    [provider]    Allergies    Tramadol  Review of Systems   Review of Systems  Unable to perform ROS: Mental status change   Physical Exam Updated Vital Signs BP (!) 177/102   Pulse (!) 127   Temp 99 F (37.2 C) (Oral)   Resp 16   SpO2 97%   Physical Exam Vitals and nursing note reviewed.  Constitutional:      Appearance: He is well-developed. He is diaphoretic.     Comments: Eyes are open, he looks to me when I speak but it is not answering questions  HENT:     Head: Normocephalic and atraumatic.     Right Ear: External ear normal.     Left Ear: External ear normal.  Eyes:     General: No scleral icterus.  Right eye: No discharge.        Left eye: No discharge.     Conjunctiva/sclera: Conjunctivae normal.  Neck:     Trachea: No tracheal deviation.  Cardiovascular:     Rate and Rhythm: Normal rate and regular rhythm.  Pulmonary:     Effort: Pulmonary effort is normal. No respiratory distress.     Breath sounds: Normal breath sounds. No stridor. No wheezing or rales.  Abdominal:     General: Bowel sounds are normal. There is no distension.     Palpations: Abdomen is soft.     Tenderness: There is no abdominal tenderness. There is no guarding or rebound.  Musculoskeletal:        General: No tenderness or deformity.     Cervical back: Neck supple.  Skin:    General: Skin is warm.     Findings: No rash.  Neurological:     Mental Status: He is alert.     Cranial Nerves: No cranial nerve deficit (no facial droop, extraocular  movements intact, not answering questions).     Motor: No abnormal muscle tone or seizure activity.  Psychiatric:        Mood and Affect: Mood normal.    ED Results / Procedures / Treatments   Labs (all labs ordered are listed, but only abnormal results are displayed) Labs Reviewed  COMPREHENSIVE METABOLIC PANEL - Abnormal; Notable for the following components:      Result Value   Glucose, Bld 103 (*)    All other components within normal limits  CBC WITH DIFFERENTIAL/PLATELET - Abnormal; Notable for the following components:   WBC 16.8 (*)    Neutro Abs 11.9 (*)    Monocytes Absolute 1.2 (*)    All other components within normal limits  CBG MONITORING, ED - Abnormal; Notable for the following components:   Glucose-Capillary 161 (*)    All other components within normal limits  RESP PANEL BY RT-PCR (FLU A&B, COVID) ARPGX2  ETHANOL  MAGNESIUM  RAPID URINE DRUG SCREEN, HOSP PERFORMED  URINALYSIS, ROUTINE W REFLEX MICROSCOPIC  CBG MONITORING, ED    EKG EKG Interpretation  Date/Time:  Tuesday August 03 2021 15:07:37 EDT Ventricular Rate:  78 PR Interval:  148 QRS Duration: 88 QT Interval:  386 QTC Calculation: 440 R Axis:   27 Text Interpretation: Normal sinus rhythm Normal ECG Confirmed by Linwood Dibbles [phone]) on 08/03/2021 6:30:20 PM  Radiology CT HEAD WO CONTRAST ( )  Result Date: 08/03/2021 CLINICAL DATA:  Delirium. EXAM: CT HEAD WITHOUT CONTRAST TECHNIQUE: Contiguous axial images were obtained from the base of the skull through the vertex without intravenous contrast. COMPARISON:  CT head 04/18/2014 BRAIN: BRAIN Chronic left frontal lobe infarction. No evidence of large-territorial acute infarction. No parenchymal hemorrhage. No mass lesion. No extra-axial collection. No mass effect or midline shift. No hydrocephalus. Basilar cisterns are patent. Vascular: No hyperdense vessel. Skull: No acute fracture or focal lesion. Sinuses/Orbits: Paranasal sinuses and mastoid air  cells are clear. The orbits are unremarkable. Other: None. IMPRESSION: No acute intracranial abnormality. Electronically Signed   By: Tish Frederickson M.D.   On: 08/03/2021 16:58    Procedures Procedures   Medications Ordered in ED Medications  sodium chloride 0.9 % bolus 1,000 mL (0 mLs Intravenous Stopped 08/03/21 1744)    And  0.9 %  sodium chloride infusion ( Intravenous New Bag/Given 08/03/21 1745)  levETIRAcetam (KEPPRA) IVPB 500 mg/100 mL premix (has no administration in time range)  LORazepam (ATIVAN) injection 2  mg (2 mg Intravenous Given 08/03/21 1624)  thiamine (B-1) injection 100 mg (100 mg Intravenous Given 08/03/21 1625)    ED Course  I have reviewed the triage vital signs and the nursing notes.  Pertinent labs & imaging results that were available during my care of the patient were reviewed by me and considered in my medical decision making (see chart for details).  Clinical Course as of 08/03/21 1839  Tue Aug 03, 2021  1829 Labs reviewed.  COVID test is negative.  CBC shows leukocytosis.  Metabolic panel was normal [JK]  1829 Head CT without acute findings [JK]  1833 Pt is alert and oriented now.  He states he has bene decreasing his alcohol consumption recently.  Admits to recent seizure last week but known since then that he is aware of.  Right now he is alert and oriented x3. [JK]  1834 HR = 95 [JK]    Clinical Course User Index [JK] Linwood Dibbles, MD   MDM Rules/Calculators/A&P                           Patient presented to the ED for evaluation of confusion.  Patient was noted to have seizure while he was in the emergency room.  Patient was postictal and confused.  Patient has now fully recovered.  He is alert and oriented to person place and time.  No signs of abnormality on head CT.  Leukocytosis noted but he is not showing any signs of infection.  Doubt meningitis encephalitis.  Patient has been cutting back on his alcohol consumption.  Suspect he could have a  component of alcohol withdrawal seizures but he does have a history of known seizure disorder.  I have ordered a dose of Keppra.  I will consult the medical service for admission and observation Final Clinical Impression(s) / ED Diagnoses Final diagnoses:  Seizure disorder (HCC)  Confusion  Hypertension, unspecified type     Linwood Dibbles, MD 08/03/21 [phone]

## 2021-08-03 NOTE — Discharge Instructions (Addendum)
Transfer to [place] Dr. Hyacinth Meeker accepting provider

## 2021-08-04 DIAGNOSIS — Z8659 Personal history of other mental and behavioral disorders: Secondary | ICD-10-CM | POA: Diagnosis not present

## 2021-08-04 DIAGNOSIS — F32A Depression, unspecified: Secondary | ICD-10-CM | POA: Diagnosis not present

## 2021-08-04 DIAGNOSIS — Z20822 Contact with and (suspected) exposure to covid-19: Secondary | ICD-10-CM | POA: Diagnosis present

## 2021-08-04 DIAGNOSIS — I10 Essential (primary) hypertension: Secondary | ICD-10-CM | POA: Diagnosis present

## 2021-08-04 DIAGNOSIS — R410 Disorientation, unspecified: Secondary | ICD-10-CM

## 2021-08-04 DIAGNOSIS — G8929 Other chronic pain: Secondary | ICD-10-CM | POA: Diagnosis present

## 2021-08-04 DIAGNOSIS — Z87820 Personal history of traumatic brain injury: Secondary | ICD-10-CM | POA: Diagnosis not present

## 2021-08-04 DIAGNOSIS — F10239 Alcohol dependence with withdrawal, unspecified: Secondary | ICD-10-CM | POA: Diagnosis present

## 2021-08-04 DIAGNOSIS — G4700 Insomnia, unspecified: Secondary | ICD-10-CM | POA: Diagnosis present

## 2021-08-04 DIAGNOSIS — R4182 Altered mental status, unspecified: Secondary | ICD-10-CM | POA: Diagnosis present

## 2021-08-04 DIAGNOSIS — Z885 Allergy status to narcotic agent status: Secondary | ICD-10-CM | POA: Diagnosis not present

## 2021-08-04 DIAGNOSIS — M549 Dorsalgia, unspecified: Secondary | ICD-10-CM | POA: Diagnosis present

## 2021-08-04 DIAGNOSIS — Z833 Family history of diabetes mellitus: Secondary | ICD-10-CM | POA: Diagnosis not present

## 2021-08-04 DIAGNOSIS — F17210 Nicotine dependence, cigarettes, uncomplicated: Secondary | ICD-10-CM | POA: Diagnosis present

## 2021-08-04 DIAGNOSIS — G40909 Epilepsy, unspecified, not intractable, without status epilepticus: Secondary | ICD-10-CM

## 2021-08-04 DIAGNOSIS — G40209 Localization-related (focal) (partial) symptomatic epilepsy and epileptic syndromes with complex partial seizures, not intractable, without status epilepticus: Secondary | ICD-10-CM | POA: Diagnosis present

## 2021-08-04 DIAGNOSIS — Z7289 Other problems related to lifestyle: Secondary | ICD-10-CM | POA: Diagnosis not present

## 2021-08-04 DIAGNOSIS — R569 Unspecified convulsions: Secondary | ICD-10-CM | POA: Diagnosis not present

## 2021-08-04 DIAGNOSIS — D72829 Elevated white blood cell count, unspecified: Secondary | ICD-10-CM | POA: Diagnosis present

## 2021-08-04 LAB — FOLATE: Folate: 57.9 ng/mL (ref >5.9)

## 2021-08-04 LAB — VITAMIN B12: Vitamin B-12: 447 pg/mL (ref 180–914)

## 2021-08-04 MED ORDER — LEVETIRACETAM IN NACL 1000 MG/100ML IV SOLN
1000.0000 mg | Freq: Once | INTRAVENOUS | Status: AC
  Administered 2021-08-04: 1000 mg via INTRAVENOUS
  Filled 2021-08-04: qty 100

## 2021-08-04 MED ORDER — NICOTINE 21 MG/24HR TD PT24
21.0000 mg | MEDICATED_PATCH | Freq: Once | TRANSDERMAL | Status: AC
Start: 2021-08-04 — End: 2021-08-05
  Administered 2021-08-04: 21 mg via TRANSDERMAL
  Filled 2021-08-04: qty 1

## 2021-08-04 MED ORDER — THIAMINE HCL 100 MG/ML IJ SOLN
500.0000 mg | Freq: Three times a day (TID) | INTRAVENOUS | Status: DC
  Administered 2021-08-04 – 2021-08-06 (×8): 500 mg via INTRAVENOUS
  Filled 2021-08-04 (×11): qty 5

## 2021-08-04 NOTE — Plan of Care (Signed)
After speaking with neurology, she wasn't sure she had much more to add  Many reasons to have seizures  Nystagmus, etoh and confusion - added aggressive thiamine 500mg  tid x3d Checking b12 and foalte Tox screens negative Hold off on LP and wait for patient to present himself with fever If still persistent confusion, complciated by presumed etoh withdrawal, could consider EEG Only received 500mg  IV keppra in ED, and went ahead with 1000mg  IV more, awaiting keppra level to return to see if has been taking.

## 2021-08-04 NOTE — ED Notes (Signed)
Family updated as to patient's status.

## 2021-08-04 NOTE — ED Notes (Signed)
Admit provider paged due to pt requesting Ativan at this time and it is not scheduled and he doesn't meet CIWA criteria

## 2021-08-04 NOTE — Plan of Care (Signed)
Patient discussed with Dr. Thornell Mule, mental status has been improving with administration of Ativan with exam consistent with ethanol withdrawal and patient providing history of the same.  No concern for ongoing seizure activity per his evaluation.  We discussed that his normal antiseizure medication regimen should be resumed and he was under dosed in the ED with 500 mg IV of Keppra instead of his normal 1500 twice daily.  We additionally discussed that given nystagmus and confusion on initial examination with history of heavy alcohol use patient should be empirically treated for Warnicke's encephalopathy with B1 testing for confirmation (though lab will likely not result prior to patient's discharge).  Exam and history as well as clinical course do not seem concerning for meningitis at this time as described to me and documented within the chart.  Regarding primary team's question as to whether the patient's breakthrough seizures and confusion could be adequately explained in the setting of alcohol withdrawal, this does seem likely based on the information presented.  Recommendations: -Aggressive thiamine repletion, as well as folate, multivitamin, B12 -Continue home antiseizure medications -Continue alcohol cessation counseling -LP should patient's clinical status decline, fail to improve as expected or if he becomes febrile -No indication for inpatient EEG unless there is concern for ongoing seizure activity -Neurology is available should a full consult be felt to be needed  Brooke Dare MD-PhD [facility] [phone] Available 7 PM to 7 AM, outside of these hours please call Neurologist on call as listed on Amion.
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
## 2021-08-04 NOTE — ED Notes (Signed)
Pt is requesting ativan at this time to help calm him down. Ciwa scale completed. Message sent to provider requesting dose for pt at this time

## 2021-08-04 NOTE — ED Notes (Signed)
Patient used call bell to request urinal. Patient used urinal without assistance. Patient has much less confusion to situation than noted earlier. Patient's thought pattern is more organized and clear. Provider notified.

## 2021-08-04 NOTE — ED Notes (Signed)
Patient denies pain and is resting comfortably.  

## 2021-08-04 NOTE — ED Notes (Signed)
Transport arrived for pt to take pt to the floor

## 2021-08-04 NOTE — ED Notes (Signed)
Found pt up walking around in room. Pt placed back in bed and placed back on the monitor at this time. Educated pt on staying in bed and using the call bell before getting out of bed. Pt verbalized understanding at this time

## 2021-08-04 NOTE — ED Notes (Signed)
Received verbal report from Andrew F RN at this time 

## 2021-08-05 ENCOUNTER — Encounter (HOSPITAL_COMMUNITY): Payer: Self-pay | Admitting: Internal Medicine

## 2021-08-05 DIAGNOSIS — F32A Depression, unspecified: Secondary | ICD-10-CM

## 2021-08-05 DIAGNOSIS — Z8659 Personal history of other mental and behavioral disorders: Secondary | ICD-10-CM

## 2021-08-05 DIAGNOSIS — Z7289 Other problems related to lifestyle: Secondary | ICD-10-CM

## 2021-08-05 LAB — COMPREHENSIVE METABOLIC PANEL
ALT: 29 U/L (ref 0–44)
AST: 25 U/L (ref 15–41)
Albumin: 3.6 g/dL (ref 3.5–5.0)
Alkaline Phosphatase: 53 U/L (ref 38–126)
Anion gap: 10 (ref 5–15)
BUN: 7 mg/dL (ref 6–20)
CO2: 23 mmol/L (ref 22–32)
Calcium: 8.6 mg/dL — ABNORMAL LOW (ref 8.9–10.3)
Chloride: 106 mmol/L (ref 98–111)
Creatinine, Ser: 0.92 mg/dL (ref 0.61–1.24)
GFR, Estimated: >60 mL/min (ref >60)
Glucose, Bld: 116 mg/dL — ABNORMAL HIGH (ref 70–99)
Potassium: 3.4 mmol/L — ABNORMAL LOW (ref 3.5–5.1)
Sodium: 139 mmol/L (ref 135–145)
Total Bilirubin: 0.9 mg/dL (ref 0.3–1.2)
Total Protein: 6.2 g/dL — ABNORMAL LOW (ref 6.5–8.1)

## 2021-08-05 LAB — CBC
HCT: 40.3 % (ref 39.0–52.0)
Hemoglobin: 13.8 g/dL (ref 13.0–17.0)
MCH: 30.7 pg (ref 26.0–34.0)
MCHC: 34.2 g/dL (ref 30.0–36.0)
MCV: 89.8 fL (ref 80.0–100.0)
Platelets: 214 10*3/uL (ref 150–400)
RBC: 4.49 MIL/uL (ref 4.22–5.81)
RDW: 12.3 % (ref 11.5–15.5)
WBC: 11.2 10*3/uL — ABNORMAL HIGH (ref 4.0–10.5)
nRBC: 0 % (ref 0.0–0.2)

## 2021-08-05 NOTE — Progress Notes (Signed)
PROGRESS NOTE    [name]  IHK:[id number] DOB: 06-May-1971 DOA: 08/03/2021 PCP: Enid Skeens., MD   Brief Narrative:   50 year old male with history of traumatic brain injury, history of bilateral subarachnoid hemorrhage and subdural hematoma, seizures, bipolar disorder, alcoholism, anxiety and depression was brought into the hospital for altered mental status.  Patient had recently returned from a trip to [place].  Patient had been drinking a lot of alcohol for several years now and was noted to be confused and delirious and was brought into the hospital.  He did have a generalized tonic-clonic seizure in the ED and was loaded on Keppra.  Assessment & Plan:   Alcohol abuse/Alcohol withdrawal seizure/History of seizure disorder -CT head was unremarkable.  On Keppra.  Initially received Keppra load.  Continue Ativan as per CIWA protocol.  Continue  IV thiamine.  Vitamin V56 and folic acid levels within normal range.  Transition of care has been consulted.  Leukocytosis Likely reactive.  History of TBI/ major depression, bipolar disorder, anxiety Seidel ideation.   DVT prophylaxis: Lovenox  Code Status: Full code  Family Communication:  Spoke with the father at bedside.  No family at bedside  Disposition Plan:  Status is: Inpatient  Remains inpatient appropriate because:Inpatient level of care appropriate due to severity of illness  Dispo: The patient is from: Home              Anticipated d/c is to: Home              Patient currently is not medically stable to d/c.   Difficult to place patient No   Consultants:  Discussed with neurology  Procedures: None  Antimicrobials:  None  Subjective: Today, patient was seen and examined at bedside.wants to go home. Denies dizziness, lightheadedness.  No further seizures.   Objective: Vitals:   08/04/21 1940 08/04/21 2237 08/04/21 2240 08/04/21 2316  BP: (!) 128/92  (!) 133/110 (!) 128/97  Pulse: (!) 109  91 92   Resp:   18 18  Temp:  97.6 F (36.4 C)  98.5 F (36.9 C)  TempSrc:  Oral  Oral  SpO2:   99% 99%  Weight:      Height:        Intake/Output Summary (Last 24 hours) at 08/05/2021 0853 Last data filed at 08/05/2021 4332 Gross per 24 hour  Intake 2621.34 ml  Output 2300 ml  Net 321.34 ml   Filed Weights   08/04/21 1938  Weight: 87.5 kg    Physical examination: General:  Average built, not in obvious distress HENT:   No scleral pallor or icterus noted. Oral mucosa is moist.  Chest:  Clear breath sounds.  Diminished breath sounds bilaterally. No crackles or wheezes.  CVS: S1 &S2 heard. No murmur.  Regular rate and rhythm. Abdomen: Soft, nontender, nondistended.  Bowel sounds are heard.   Extremities: No cyanosis, clubbing or edema.  Peripheral pulses are palpable. Psych: Alert, awake and oriented, normal mood CNS:  No cranial nerve deficits.  Power equal in all extremities.  Mild tremor noted Skin: Warm and dry.  No rashes noted.  Data Reviewed: I have reviewed the following labs and imaging studies  CBC: Recent Labs  Lab 08/03/21 1515 08/05/21 0204  WBC 16.8* 11.2*  NEUTROABS 11.9*  --   HGB 16.2 13.8  HCT 47.7 40.3  MCV 91.6 89.8  PLT 301 951    Basic Metabolic Panel: Recent Labs  Lab 08/03/21 1515 08/05/21 0204  NA  139 139  K 3.8 3.4*  CL 104 106  CO2 22 23  GLUCOSE 103* 116*  BUN 18 7  CREATININE 1.02 0.92  CALCIUM 9.7 8.6*  MG 2.4  --     GFR: Estimated Creatinine Clearance: 103.3 mL/min (by C-G formula based on SCr of 0.92 mg/dL). Liver Function Tests: Recent Labs  Lab 08/03/21 1515 08/05/21 0204  AST 25 25  ALT 41 29  ALKPHOS 63 53  BILITOT 0.6 0.9  PROT 8.1 6.2*  ALBUMIN 5.0 3.6    No results for input(s): LIPASE, AMYLASE in the last 168 hours. No results for input(s): AMMONIA in the last 168 hours. Coagulation Profile: Recent Labs  Lab 08/03/21 2051  INR 1.0    Cardiac Enzymes: No results for input(s): CKTOTAL, CKMB,  CKMBINDEX, TROPONINI in the last 168 hours. BNP (last 3 results) No results for input(s): PROBNP in the last 8760 hours. HbA1C: No results for input(s): HGBA1C in the last 72 hours. CBG: Recent Labs  Lab 08/03/21 1617  GLUCAP 161*    Lipid Profile: No results for input(s): CHOL, HDL, LDLCALC, TRIG, CHOLHDL, LDLDIRECT in the last 72 hours. Thyroid Function Tests: Recent Labs    08/03/21 2051  TSH 0.522    Anemia Panel: Recent Labs    08/04/21 0113  VITAMINB12 447  FOLATE 57.9    Urine analysis:    Component Value Date/Time   COLORURINE YELLOW 08/03/2021 [place] 08/03/2021 1515   LABSPEC 1.024 08/03/2021 1515   PHURINE 5.0 08/03/2021 1515   GLUCOSEU NEGATIVE 08/03/2021 1515   HGBUR NEGATIVE 08/03/2021 1515   BILIRUBINUR NEGATIVE 08/03/2021 1515   KETONESUR 5 (A) 08/03/2021 1515   PROTEINUR 100 (A) 08/03/2021 1515   UROBILINOGEN 0.2 01/01/2012 1244   NITRITE NEGATIVE 08/03/2021 1515   LEUKOCYTESUR NEGATIVE 08/03/2021 1515   Sepsis Labs: @LABRCNTIP (procalcitonin:4,lacticidven:4)  ) Recent Results (from the past 240 hour(s))  Resp Panel by RT-PCR (Flu A&B, Covid) Nasopharyngeal Swab     Status: None   Collection Time: 08/03/21  3:21 PM   Specimen: Nasopharyngeal Swab; Nasopharyngeal(NP) swabs in vial transport medium  Result Value Ref Range Status   SARS Coronavirus 2 by RT PCR NEGATIVE NEGATIVE Final    Comment: (NOTE) SARS-CoV-2 target nucleic acids are NOT DETECTED.  The SARS-CoV-2 RNA is generally detectable in upper respiratory specimens during the acute phase of infection. The lowest concentration of SARS-CoV-2 viral copies this assay can detect is 138 copies/mL. A negative result does not preclude SARS-Cov-2 infection and should not be used as the sole basis for treatment or other patient management decisions. A negative result may occur with  improper specimen collection/handling, submission of specimen other than nasopharyngeal  swab, presence of viral mutation(s) within the areas targeted by this assay, and inadequate number of viral copies(<138 copies/mL). A negative result must be combined with clinical observations, patient history, and epidemiological information. The expected result is Negative.  Fact Sheet for Patients:  EntrepreneurPulse.com.au  Fact Sheet for Healthcare Providers:  IncredibleEmployment.be  This test is no t yet approved or cleared by the [place] FDA and  has been authorized for detection and/or diagnosis of SARS-CoV-2 by FDA under an Emergency Use Authorization (EUA). This EUA will remain  in effect (meaning this test can be used) for the duration of the COVID-19 declaration under Section 564(b)(1) of the Act, 21 U.S.C.section 360bbb-3(b)(1), unless the authorization is terminated  or revoked sooner.       Influenza A by PCR NEGATIVE  NEGATIVE Final   Influenza B by PCR NEGATIVE NEGATIVE Final    Comment: (NOTE) The Xpert Xpress SARS-CoV-2/FLU/RSV plus assay is intended as an aid in the diagnosis of influenza from Nasopharyngeal swab specimens and should not be used as a sole basis for treatment. Nasal washings and aspirates are unacceptable for Xpert Xpress SARS-CoV-2/FLU/RSV testing.  Fact Sheet for Patients: EntrepreneurPulse.com.au  Fact Sheet for Healthcare Providers: IncredibleEmployment.be  This test is not yet approved or cleared by the [place] FDA and has been authorized for detection and/or diagnosis of SARS-CoV-2 by FDA under an Emergency Use Authorization (EUA). This EUA will remain in effect (meaning this test can be used) for the duration of the COVID-19 declaration under Section 564(b)(1) of the Act, 21 U.S.C. section 360bbb-3(b)(1), unless the authorization is terminated or revoked.  Performed at [facility], [place] [address], [place],  [address]       Radiology Studies: CT HEAD WO CONTRAST (5MM)  Result Date: 08/03/2021 CLINICAL DATA:  Delirium. EXAM: CT HEAD WITHOUT CONTRAST TECHNIQUE: Contiguous axial images were obtained from the base of the skull through the vertex without intravenous contrast. COMPARISON:  CT head 04/18/2014 BRAIN: BRAIN Chronic left frontal lobe infarction. No evidence of large-territorial acute infarction. No parenchymal hemorrhage. No mass lesion. No extra-axial collection. No mass effect or midline shift. No hydrocephalus. Basilar cisterns are patent. Vascular: No hyperdense vessel. Skull: No acute fracture or focal lesion. Sinuses/Orbits: Paranasal sinuses and mastoid air cells are clear. The orbits are unremarkable. Other: None. IMPRESSION: No acute intracranial abnormality. Electronically Signed   By: Iven Finn M.D.   On: 08/03/2021 16:58     Scheduled Meds:  chlorhexidine gluconate (MEDLINE KIT)  15 mL Mouth Rinse BID   enoxaparin (LOVENOX) injection  40 mg Subcutaneous Q24H   famotidine  20 mg Oral BID   folic acid  1 mg Oral Daily   levETIRAcetam  1,500 mg Oral Daily   mouth rinse  15 mL Mouth Rinse 10 times per day   multivitamin with minerals  1 tablet Oral Daily   nicotine  21 mg Transdermal Once   Continuous Infusions:  sodium chloride 75 mL/hr (08/04/21 2122)   thiamine injection Stopped (08/04/21 2317)     LOS: 1 day    Flora Lipps, MD [facility] 08/05/2021, 8:53 AM

## 2021-08-05 NOTE — Plan of Care (Signed)
  Problem: Education: Goal: Knowledge of General Education information will improve Description: Including pain rating scale, medication(s)/side effects and non-pharmacologic comfort measures 08/05/2021 1958 by Charma Igo, LPN Outcome: Progressing 08/05/2021 1958 by Charma Igo, LPN Outcome: Progressing   Problem: Health Behavior/Discharge Planning: Goal: Ability to manage health-related needs will improve 08/05/2021 1958 by Charma Igo, LPN Outcome: Progressing 08/05/2021 1958 by Charma Igo, LPN Outcome: Progressing   Problem: Clinical Measurements: Goal: Ability to maintain clinical measurements within normal limits will improve 08/05/2021 1958 by Charma Igo, LPN Outcome: Progressing 08/05/2021 1958 by Charma Igo, LPN Outcome: Progressing Goal: Will remain free from infection 08/05/2021 1958 by Charma Igo, LPN Outcome: Progressing 08/05/2021 1958 by Charma Igo, LPN Outcome: Progressing Goal: Diagnostic test results will improve 08/05/2021 1958 by Charma Igo, LPN Outcome: Progressing 08/05/2021 1958 by Charma Igo, LPN Outcome: Progressing Goal: Respiratory complications will improve 08/05/2021 1958 by Charma Igo, LPN Outcome: Progressing 08/05/2021 1958 by Charma Igo, LPN Outcome: Progressing Goal: Cardiovascular complication will be avoided 08/05/2021 1958 by Charma Igo, LPN Outcome: Progressing 08/05/2021 1958 by Charma Igo, LPN Outcome: Progressing   Problem: Activity: Goal: Risk for activity intolerance will decrease 08/05/2021 1958 by Charma Igo, LPN Outcome: Progressing 08/05/2021 1958 by Charma Igo, LPN Outcome: Progressing   Problem: Nutrition: Goal: Adequate nutrition will be maintained 08/05/2021 1958 by Charma Igo, LPN Outcome: Progressing 08/05/2021 1958 by Charma Igo, LPN Outcome: Progressing   Problem: Coping: Goal: Level of anxiety will decrease 08/05/2021  1958 by Charma Igo, LPN Outcome: Progressing 08/05/2021 1958 by Charma Igo, LPN Outcome: Progressing   Problem: Elimination: Goal: Will not experience complications related to bowel motility 08/05/2021 1958 by Charma Igo, LPN Outcome: Progressing 08/05/2021 1958 by Charma Igo, LPN Outcome: Progressing Goal: Will not experience complications related to urinary retention 08/05/2021 1958 by Charma Igo, LPN Outcome: Progressing 08/05/2021 1958 by Charma Igo, LPN Outcome: Progressing   Problem: Pain Managment: Goal: General experience of comfort will improve 08/05/2021 1958 by Charma Igo, LPN Outcome: Progressing 08/05/2021 1958 by Charma Igo, LPN Outcome: Progressing   Problem: Safety: Goal: Ability to remain free from injury will improve 08/05/2021 1958 by Charma Igo, LPN Outcome: Progressing 08/05/2021 1958 by Charma Igo, LPN Outcome: Progressing   Problem: Skin Integrity: Goal: Risk for impaired skin integrity will decrease 08/05/2021 1958 by Charma Igo, LPN Outcome: Progressing 08/05/2021 1958 by Charma Igo, LPN Outcome: Progressing

## 2021-08-05 NOTE — TOC CAGE-AID Note (Signed)
Transition of Care [facility]) - CAGE-AID Screening   Patient Details  Name: [name] MRN: [id number] Date of Birth: 1971-10-08  Transition of Care [facility]) CM/SW Contact:    Joanne Chars, LCSW Phone Number: 08/05/2021, 1:56 PM   Clinical Narrative:  CSW met with pt to complete Cage Aid. Pt denies regular ETOH use.  Pt reports he lives in [place] with his girfriend 6 months at a time, returned to [place] in late August, and went on a 4 day binge of drinking 12 beers per day in early September.  Prior to that he had gone months at a time without drinking at all.  Pt denies illegal drug use.  Pt reports he does not have a substance use problem.  CSW provided list of treatment providers if ever needed.      CAGE-AID Screening:    Have You Ever Felt You Ought to Cut Down on Your Drinking or Drug Use?: No Have People Annoyed You By Critizing Your Drinking Or Drug Use?: Yes Have You Felt Bad Or Guilty About Your Drinking Or Drug Use?: No Have You Ever Had a Drink or Used Drugs First Thing In The Morning to Steady Your Nerves or to Get Rid of a Hangover?: No CAGE-AID Score: 1  Substance Abuse Education Offered: Yes  Substance abuse interventions: Other (must comment) (resource/contact list of treatment providers)

## 2021-08-06 DIAGNOSIS — G9341 Metabolic encephalopathy: Secondary | ICD-10-CM

## 2021-08-06 DIAGNOSIS — I10 Essential (primary) hypertension: Secondary | ICD-10-CM

## 2021-08-06 LAB — BASIC METABOLIC PANEL
Anion gap: 11 (ref 5–15)
BUN: 11 mg/dL (ref 6–20)
CO2: 23 mmol/L (ref 22–32)
Calcium: 9.1 mg/dL (ref 8.9–10.3)
Chloride: 105 mmol/L (ref 98–111)
Creatinine, Ser: 0.99 mg/dL (ref 0.61–1.24)
GFR, Estimated: >60 mL/min (ref >60)
Glucose, Bld: 96 mg/dL (ref 70–99)
Potassium: 3.9 mmol/L (ref 3.5–5.1)
Sodium: 139 mmol/L (ref 135–145)

## 2021-08-06 LAB — CBC
HCT: 37.9 % — ABNORMAL LOW (ref 39.0–52.0)
Hemoglobin: 13.2 g/dL (ref 13.0–17.0)
MCH: 30.9 pg (ref 26.0–34.0)
MCHC: 34.8 g/dL (ref 30.0–36.0)
MCV: 88.8 fL (ref 80.0–100.0)
Platelets: 226 10*3/uL (ref 150–400)
RBC: 4.27 MIL/uL (ref 4.22–5.81)
RDW: 12.1 % (ref 11.5–15.5)
WBC: 9.6 10*3/uL (ref 4.0–10.5)
nRBC: 0 % (ref 0.0–0.2)

## 2021-08-06 LAB — MAGNESIUM: Magnesium: 2.1 mg/dL (ref 1.7–2.4)

## 2021-08-06 LAB — LEVETIRACETAM LEVEL: Levetiracetam Lvl: 3 ug/mL — ABNORMAL LOW (ref 10.0–40.0)

## 2021-08-06 MED ORDER — FOLIC ACID 1 MG PO TABS: 1.0000 mg | ORAL_TABLET | Freq: Every day | ORAL | 2 refills | Status: DC

## 2021-08-06 MED ORDER — TRAZODONE HCL 50 MG PO TABS: 50.0000 mg | ORAL_TABLET | Freq: Every evening | ORAL | 0 refills | Status: DC | PRN

## 2021-08-06 MED ORDER — THIAMINE HCL 100 MG PO TABS
100.0000 mg | ORAL_TABLET | Freq: Every day | ORAL | 0 refills | Status: DC
Start: 2021-08-06 — End: 2022-05-11

## 2021-08-06 NOTE — Discharge Summary (Signed)
Physician Discharge Summary  [name]:[id number] DOB: 05/04/1971 DOA: 08/03/2021  PCP: Nonnie Done., MD  Admit date: 08/03/2021 Discharge date: 08/06/2021  Admitted From: Home  Discharge disposition: Home  Recommendations for Outpatient Follow-Up:   Follow up with your primary care provider in one week.  Check CBC, BMP, magnesium in the next visit  Discharge Diagnosis:   Active Problems:   Depression   HTN (hypertension)   Seizures (HCC)   Altered mental status   History of suicidal ideation   Alcohol use   AMS (altered mental status)  Discharge Condition: Improved.  Diet recommendation: Low sodium, heart healthy.  Wound care: None.  Code status: Full.   History of Present Illness:  50 year old male with history of traumatic brain injury, history of bilateral subarachnoid hemorrhage and subdural hematoma, seizures, bipolar disorder, alcoholism, anxiety and depression was brought into the hospital for altered mental status.  Patient had recently returned from a trip to [place].  Patient had been drinking a lot of alcohol for several years now and was noted to be confused and delirious and was brought into the hospital.  He did have a generalized tonic-clonic seizure in the ED and was loaded on Keppra.  Patient stated that he did not have a drink for almost 4 days prior to presentation.  Hospital Course:   Following conditions were addressed during hospitalization as listed below,  Metabolic encephalopathy likely post ictal/mild alcohol withdrawal.   Resolved at this time.  Has not received much Ativan since yesterday.  No signs of withdrawal at this time  History of seizure disorder -CT head was unremarkable.  On Keppra.  Initially received Keppra load in the ED.Marland Kitchen  Continue thiamine and folic acid on discharge.     Leukocytosis Likely reactive.   History of TBI/ major depression, bipolar disorder, anxiety No suicidal ideation.  Patient states that he  is not on the medication.  We will give trazodone for the next few days to help with insomnia  Disposition.  At this time, patient is stable for disposition home with outpatient PCP follow-up.  Spoke with the patient's father at bedside  Medical Consultants:   Neurology  Procedures:    None Subjective:   Today, patient was seen and examined at bedside.  Patient denies any dizziness, lightheadedness, shortness of breath, tremors or hallucinations  Discharge Exam:   Vitals:   08/05/21 2136 08/06/21 0614  BP: (!) 136/93 (!) 122/95  Pulse: 77 90  Resp: 18 18  Temp: 98.4 F (36.9 C) 97.9 F (36.6 C)  SpO2: 98% 99%   Vitals:   08/05/21 1600 08/05/21 1950 08/05/21 2136 08/06/21 0614  BP: (!) 130/99 (!) 140/100 (!) 136/93 (!) 122/95  Pulse: 100 (!) 102 77 90  Resp:   18 18  Temp:  98.3 F (36.8 C) 98.4 F (36.9 C) 97.9 F (36.6 C)  TempSrc:  Oral Oral Oral  SpO2:  98% 98% 99%  Weight:    89.1 kg  Height:       Body mass index is 29.87 kg/m.  General: Alert awake, not in obvious distress, HENT: pupils equally reacting to light,  No scleral pallor or icterus noted. Oral mucosa is moist.  Chest:  Clear breath sounds.  Diminished breath sounds bilaterally. No crackles or wheezes.  CVS: S1 &S2 heard. No murmur.  Regular rate and rhythm. Abdomen: Soft, nontender, nondistended.  Bowel sounds are heard.   Extremities: No cyanosis, clubbing or edema.  Peripheral pulses are  palpable. Psych: Alert, awake and oriented, normal mood CNS:  No cranial nerve deficits.  Power equal in all extremities.   Skin: Warm and dry.  No rashes noted.  The results of significant diagnostics from this hospitalization (including imaging, microbiology, ancillary and laboratory) are listed below for reference.     Diagnostic Studies:   CT HEAD WO CONTRAST ( )  Result Date: 08/03/2021 CLINICAL DATA:  Delirium. EXAM: CT HEAD WITHOUT CONTRAST TECHNIQUE: Contiguous axial images were obtained from  the base of the skull through the vertex without intravenous contrast. COMPARISON:  CT head 04/18/2014 BRAIN: BRAIN Chronic left frontal lobe infarction. No evidence of large-territorial acute infarction. No parenchymal hemorrhage. No mass lesion. No extra-axial collection. No mass effect or midline shift. No hydrocephalus. Basilar cisterns are patent. Vascular: No hyperdense vessel. Skull: No acute fracture or focal lesion. Sinuses/Orbits: Paranasal sinuses and mastoid air cells are clear. The orbits are unremarkable. Other: None. IMPRESSION: No acute intracranial abnormality. Electronically Signed   By: Tish Frederickson M.D.   On: 08/03/2021 16:58     Labs:   Basic Metabolic Panel: Recent Labs  Lab 08/03/21 1515 08/05/21 0204 08/06/21 0159  NA 139 139 139  K 3.8 3.4* 3.9  CL 104 106 105  CO2 22 23 23   GLUCOSE 103* 116* 96  BUN 18 7 11   CREATININE 1.02 0.92 0.99  CALCIUM 9.7 8.6* 9.1  MG 2.4  --  2.1   GFR Estimated Creatinine Clearance: 96.8 mL/min (by C-G formula based on SCr of 0.99 mg/dL). Liver Function Tests: Recent Labs  Lab 08/03/21 1515 08/05/21 0204  AST 25 25  ALT 41 29  ALKPHOS 63 53  BILITOT 0.6 0.9  PROT 8.1 6.2*  ALBUMIN 5.0 3.6   No results for input(s): LIPASE, AMYLASE in the last 168 hours. No results for input(s): AMMONIA in the last 168 hours. Coagulation profile Recent Labs  Lab 08/03/21 2051  INR 1.0    CBC: Recent Labs  Lab 08/03/21 1515 08/05/21 0204 08/06/21 0159  WBC 16.8* 11.2* 9.6  NEUTROABS 11.9*  --   --   HGB 16.2 13.8 13.2  HCT 47.7 40.3 37.9*  MCV 91.6 89.8 88.8  PLT 301 214 226   Cardiac Enzymes: No results for input(s): CKTOTAL, CKMB, CKMBINDEX, TROPONINI in the last 168 hours. BNP: Invalid input(s): POCBNP CBG: Recent Labs  Lab 08/03/21 1617  GLUCAP 161*   D-Dimer No results for input(s): DDIMER in the last 72 hours. Hgb A1c No results for input(s): HGBA1C in the last 72 hours. Lipid Profile No results for  input(s): CHOL, HDL, LDLCALC, TRIG, CHOLHDL, LDLDIRECT in the last 72 hours. Thyroid function studies Recent Labs    08/03/21 2051  TSH 0.522   Anemia work up Recent Labs    08/04/21 0113  VITAMINB12 447  FOLATE 57.9   Microbiology Recent Results (from the past 240 hour(s))  Resp Panel by RT-PCR (Flu A&B, Covid) Nasopharyngeal Swab     Status: None   Collection Time: 08/03/21  3:21 PM   Specimen: Nasopharyngeal Swab; Nasopharyngeal(NP) swabs in vial transport medium  Result Value Ref Range Status   SARS Coronavirus 2 by RT PCR NEGATIVE NEGATIVE Final    Comment: (NOTE) SARS-CoV-2 target nucleic acids are NOT DETECTED.  The SARS-CoV-2 RNA is generally detectable in upper respiratory specimens during the acute phase of infection. The lowest concentration of SARS-CoV-2 viral copies this assay can detect is 138 copies/mL. A negative result does not preclude SARS-Cov-2 infection and should  not be used as the sole basis for treatment or other patient management decisions. A negative result may occur with  improper specimen collection/handling, submission of specimen other than nasopharyngeal swab, presence of viral mutation(s) within the areas targeted by this assay, and inadequate number of viral copies(<138 copies/mL). A negative result must be combined with clinical observations, patient history, and epidemiological information. The expected result is Negative.  Fact Sheet for Patients:  BloggerCourse.com  Fact Sheet for Healthcare Providers:  SeriousBroker.it  This test is no t yet approved or cleared by the [place] FDA and  has been authorized for detection and/or diagnosis of SARS-CoV-2 by FDA under an Emergency Use Authorization (EUA). This EUA will remain  in effect (meaning this test can be used) for the duration of the COVID-19 declaration under Section 564(b)(1) of the Act, 21 U.S.C.section 360bbb-3(b)(1),  unless the authorization is terminated  or revoked sooner.       Influenza A by PCR NEGATIVE NEGATIVE Final   Influenza B by PCR NEGATIVE NEGATIVE Final    Comment: (NOTE) The Xpert Xpress SARS-CoV-2/FLU/RSV plus assay is intended as an aid in the diagnosis of influenza from Nasopharyngeal swab specimens and should not be used as a sole basis for treatment. Nasal washings and aspirates are unacceptable for Xpert Xpress SARS-CoV-2/FLU/RSV testing.  Fact Sheet for Patients: BloggerCourse.com  Fact Sheet for Healthcare Providers: SeriousBroker.it  This test is not yet approved or cleared by the [place] FDA and has been authorized for detection and/or diagnosis of SARS-CoV-2 by FDA under an Emergency Use Authorization (EUA). This EUA will remain in effect (meaning this test can be used) for the duration of the COVID-19 declaration under Section 564(b)(1) of the Act, 21 U.S.C. section 360bbb-3(b)(1), unless the authorization is terminated or revoked.  Performed at [facility], [address] [place], [place] [address]      Discharge Instructions:   Discharge Instructions     Diet general   Complete by: As directed    Discharge instructions   Complete by: As directed    Follow up with your primary care provider in one week. Avoid alcoholic beverages. Seek medical attention for further seizures or worsening symptoms.   Increase activity slowly   Complete by: As directed       Allergies as of 08/06/2021       Reactions   Tramadol    unknown        Medication List     TAKE these medications    acetaminophen 500 MG tablet Commonly known as: TYLENOL Take 1,000 mg by mouth every 6 (six) hours as needed for moderate pain or headache.   folic acid 1 MG tablet Commonly known as: FOLVITE Take 1 tablet (1 mg total) by mouth daily.   levETIRAcetam 500 MG 24 hr tablet Commonly known as: KEPPRA XR Take 3  tablets (1,500 mg total) by mouth daily.   multivitamin tablet Take 1 tablet by mouth daily.   thiamine 100 MG tablet Take 1 tablet (100 mg total) by mouth daily.   traZODone 50 MG tablet Commonly known as: DESYREL Take 1 tablet (50 mg total) by mouth at bedtime as needed for up to 10 days for sleep.   VISINE DRY EYE OP Apply 1-2 drops to eye daily as needed (dry eyes).        Follow-up Information     [place], Excell Seltzer., MD. Schedule an appointment as soon as possible for a visit in 1 week(s).  Specialty: Family Medicine Contact information: [address] [place] [address] [phone]                  Time coordinating discharge: 39 minutes  Signed:  Missy Baksh  [facility] 08/06/2021, 8:24 AM

## 2021-11-18 IMAGING — CT CT HEAD W/O CM
3 of 4 series · 15 of 47 positions shown, 18 images · non-contrast
Comparison: CT head 04/18/2014

CLINICAL DATA: Delirium.

EXAM:
CT HEAD WITHOUT CONTRAST
TECHNIQUE: Contiguous axial images were obtained from the base of the skull
through the vertex without intravenous contrast.

[Series 5: head 2.0 h70h · axial · 0.43mm/px · z∈[-54,+78]mm · 9 of 84 slices shown, 12 images]
[im 9/84  brain]
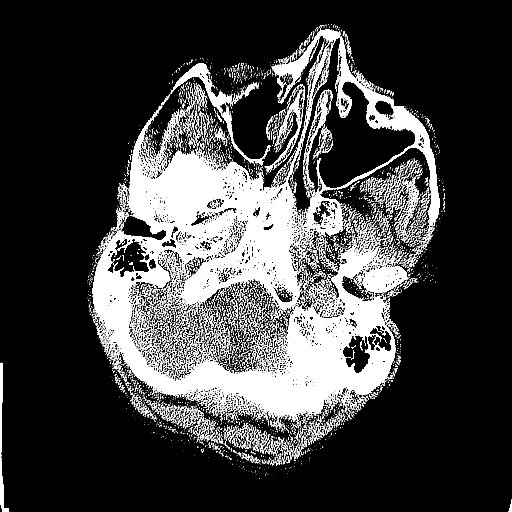
[im 9/84  bone]
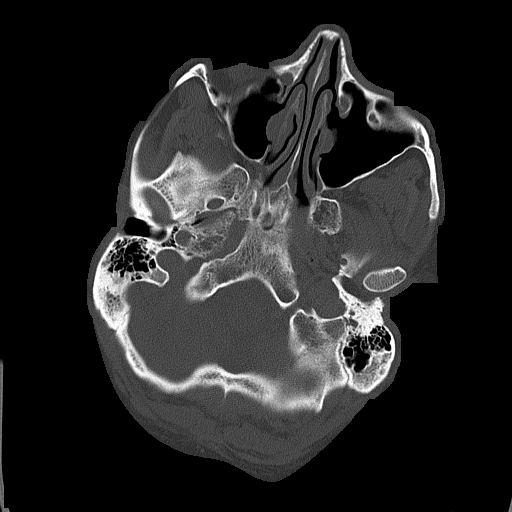
[im 17/84  brain]
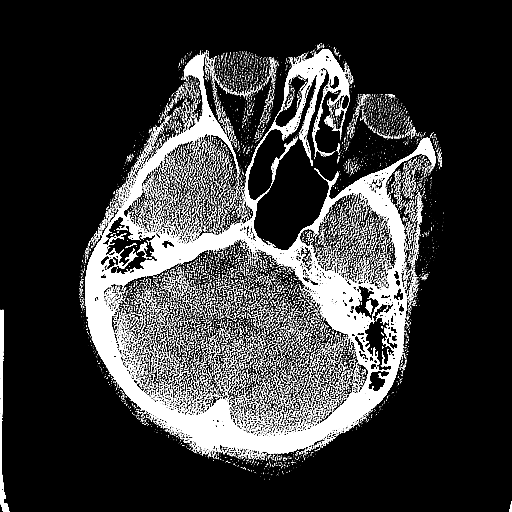
[im 25/84  brain]
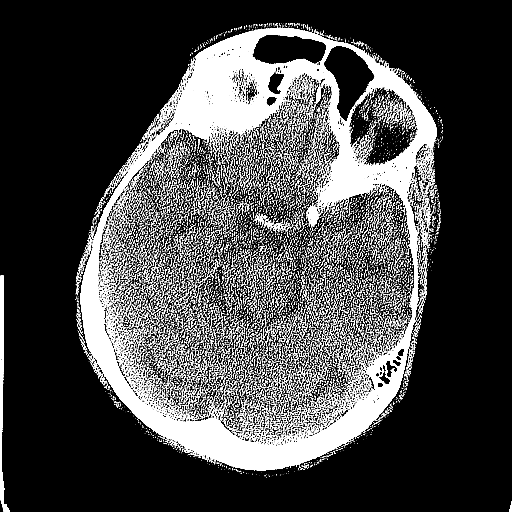
[im 34/84  brain]
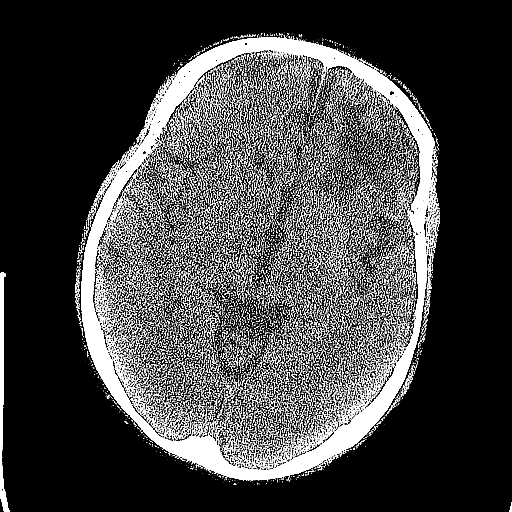
[im 42/84  brain]
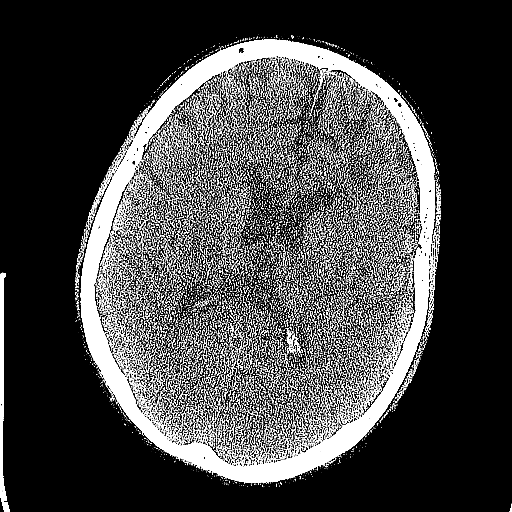
[im 42/84  bone]
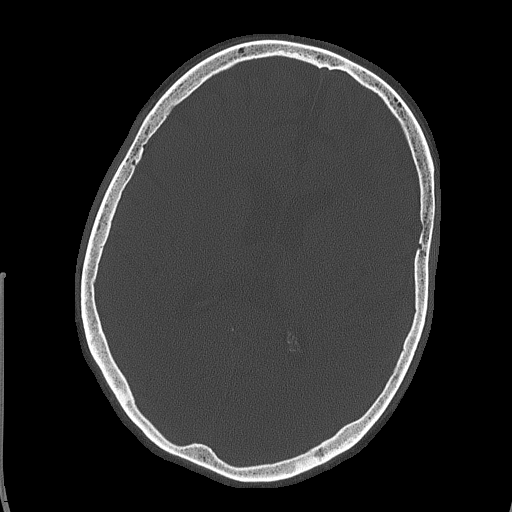
[im 50/84  brain]
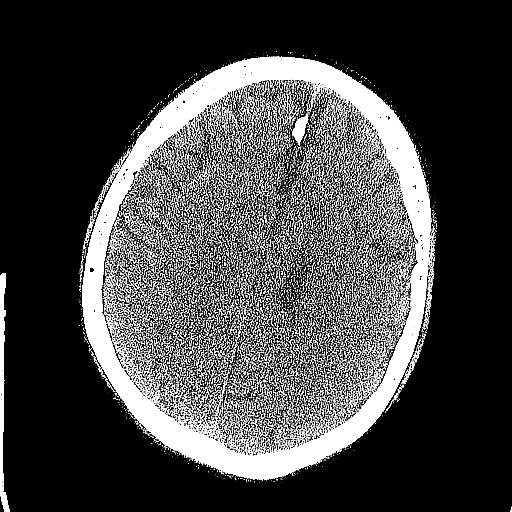
[im 59/84  brain]
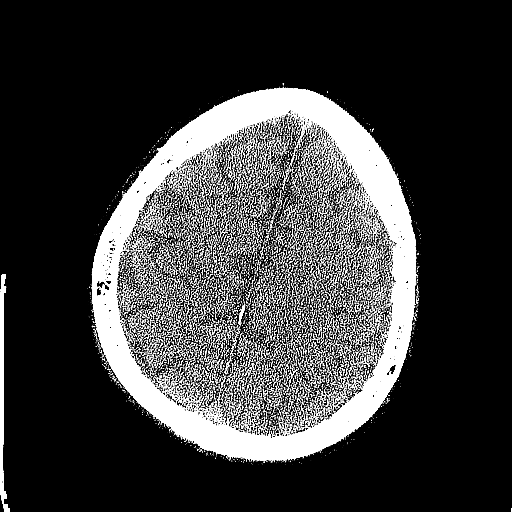
[im 67/84  brain]
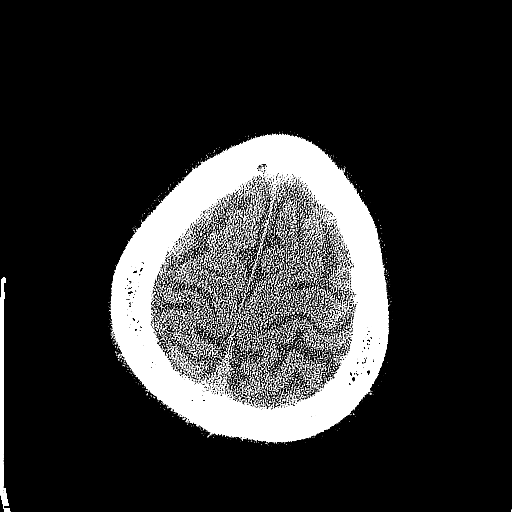
[im 75/84  brain]
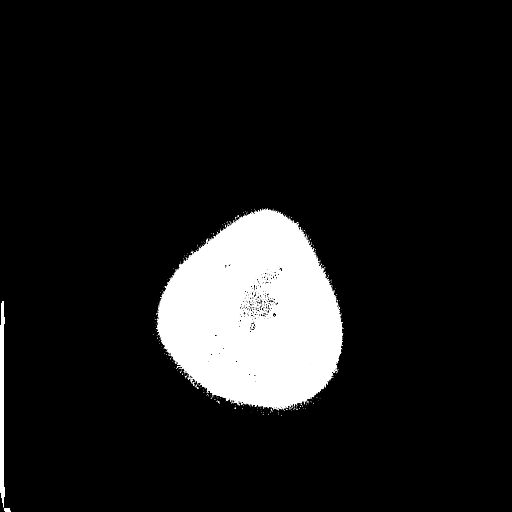
[im 75/84  bone]
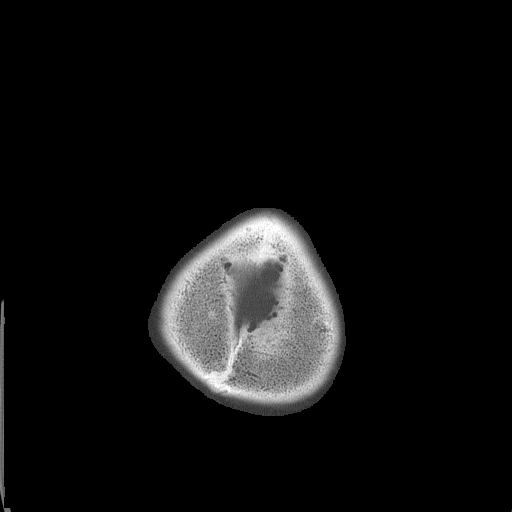

[Series 6: head 3.0 mpr cor · coronal · 0.33mm/px · 3 of 69 slices shown]
[im 23/69  brain]
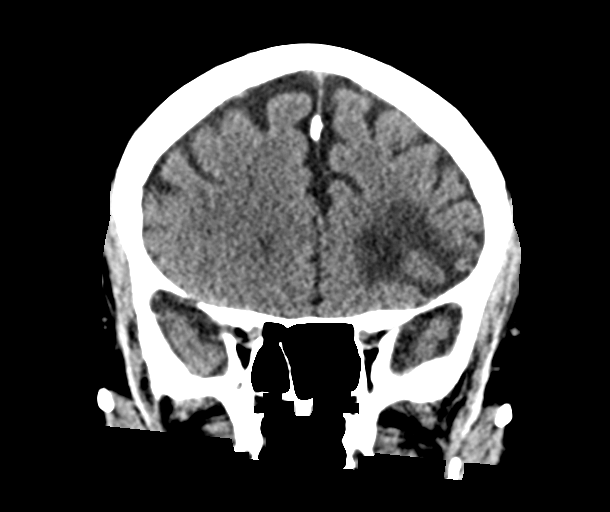
[im 31/69  brain]
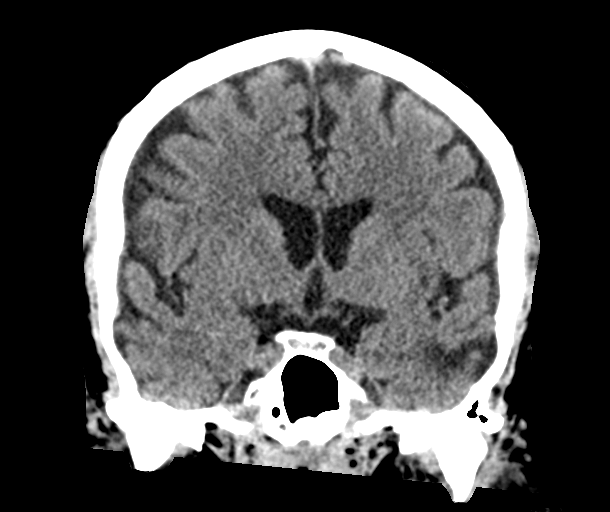
[im 38/69  brain]
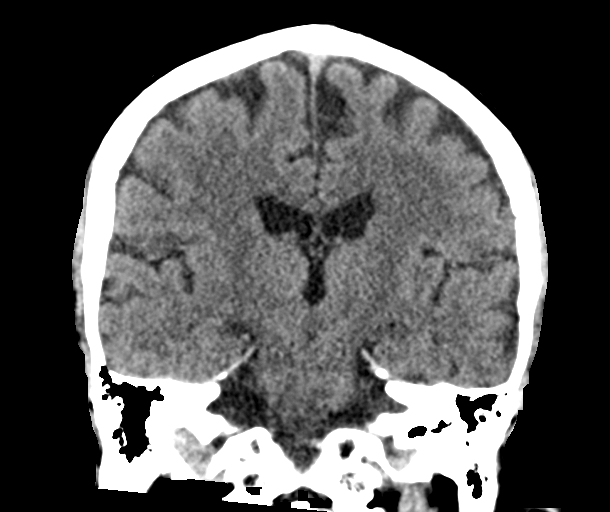

[Series 7: head 3.0 mpr sag · sagittal · 0.33mm/px · 3 of 59 slices shown]
[im 20/59  brain]
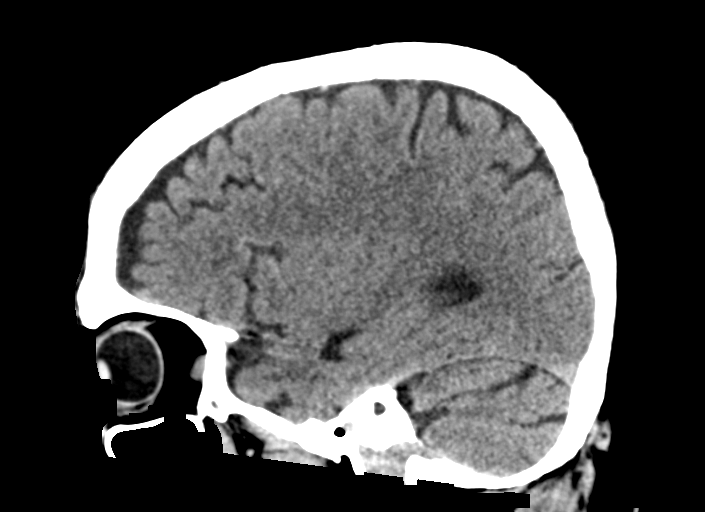
[im 30/59  brain]
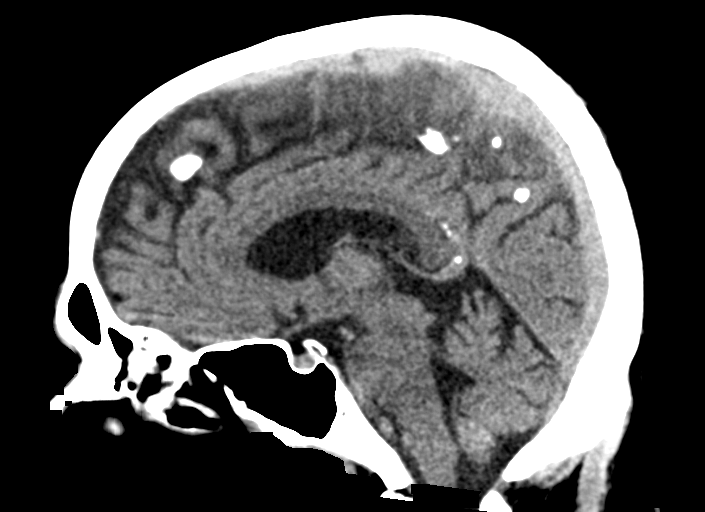
[im 39/59  brain]
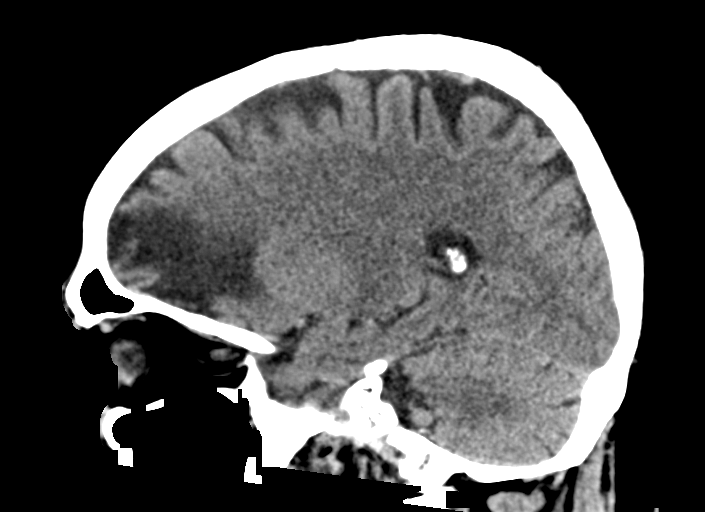

[15 of 47 positions shown; findings below may reference images not displayed]

BRAIN:
BRAIN
Chronic left frontal lobe infarction.

No evidence of large-territorial acute infarction. No parenchymal
hemorrhage. No mass lesion. No extra-axial collection.

No mass effect or midline shift. No hydrocephalus. Basilar cisterns
are patent.

Vascular: No hyperdense vessel.

Skull: No acute fracture or focal lesion.

Sinuses/Orbits: Paranasal sinuses and mastoid air cells are clear.
The orbits are unremarkable.

Other: None.
IMPRESSION: No acute intracranial abnormality.

## 2022-02-23 ENCOUNTER — Ambulatory Visit: Payer: Medicare Other | Admitting: Neurology

## 2022-03-31 ENCOUNTER — Other Ambulatory Visit: Payer: Self-pay | Admitting: Neurology

## 2022-03-31 NOTE — Telephone Encounter (Signed)
I called the patient, I refilled his Keppra, please schedule him for follow-up with me at earliest available appointment.  ? ?Meds ordered this encounter  ?Medications  ? levETIRAcetam (KEPPRA XR) 500 MG 24 hr tablet  ?  Sig: TAKE 3 TABLETS (1,500 MG TOTAL) BY MOUTH DAILY.  ?  Dispense:  270 tablet  ?  Refill:  1  ?  Pt must schedule yearly f/u for further refills.  ? ? ?
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
## 2022-05-11 ENCOUNTER — Encounter: Payer: Self-pay | Admitting: Neurology

## 2022-05-11 ENCOUNTER — Ambulatory Visit: Payer: Medicare HMO | Admitting: Neurology

## 2022-05-11 VITALS — BP 148/99 | HR 117 | Ht 68.0 in | Wt 214.0 lb

## 2022-05-11 DIAGNOSIS — G40319 Generalized idiopathic epilepsy and epileptic syndromes, intractable, without status epilepticus: Secondary | ICD-10-CM

## 2022-05-11 MED ORDER — LEVETIRACETAM ER 500 MG PO TB24: 1500.0000 mg | ORAL_TABLET | Freq: Every day | ORAL | 4 refills | Status: DC

## 2022-05-11 NOTE — Patient Instructions (Signed)
Meds ordered this encounter  Medications   levETIRAcetam (KEPPRA XR) 500 MG 24 hr tablet    Sig: Take 3 tablets (1,500 mg total) by mouth daily.    Dispense:  270 tablet    Refill:  4   Check labs today  Call for any seizures Avoid drinking alcohol Call for any seizures

## 2022-05-12 LAB — COMPREHENSIVE METABOLIC PANEL
ALT: 49 IU/L — ABNORMAL HIGH (ref 0–44)
AST: 30 IU/L (ref 0–40)
Albumin/Globulin Ratio: 2.3 — ABNORMAL HIGH (ref 1.2–2.2)
Albumin: 5.4 g/dL — ABNORMAL HIGH (ref 3.8–4.9)
Alkaline Phosphatase: 70 IU/L (ref 44–121)
BUN/Creatinine Ratio: 17 (ref 9–20)
BUN: 17 mg/dL (ref 6–24)
Bilirubin Total: <0.2 mg/dL (ref 0.0–1.2)
CO2: 19 mmol/L — ABNORMAL LOW (ref 20–29)
Calcium: 10.6 mg/dL — ABNORMAL HIGH (ref 8.7–10.2)
Chloride: 98 mmol/L (ref 96–106)
Creatinine, Ser: 1.01 mg/dL (ref 0.76–1.27)
Globulin, Total: 2.4 g/dL (ref 1.5–4.5)
Glucose: 103 mg/dL — ABNORMAL HIGH (ref 70–99)
Potassium: 4.7 mmol/L (ref 3.5–5.2)
Sodium: 136 mmol/L (ref 134–144)
Total Protein: 7.8 g/dL (ref 6.0–8.5)
eGFR: 90 mL/min/{1.73_m2} (ref >59)

## 2022-05-12 LAB — CBC WITH DIFFERENTIAL/PLATELET
Basophils Absolute: 0.1 10*3/uL (ref 0.0–0.2)
Basos: 1 %
EOS (ABSOLUTE): 0.6 10*3/uL — ABNORMAL HIGH (ref 0.0–0.4)
Eos: 5 %
Hematocrit: 47.6 % (ref 37.5–51.0)
Hemoglobin: 16.3 g/dL (ref 13.0–17.7)
Immature Grans (Abs): 0.1 10*3/uL (ref 0.0–0.1)
Immature Granulocytes: 1 %
Lymphocytes Absolute: 3.2 10*3/uL — ABNORMAL HIGH (ref 0.7–3.1)
Lymphs: 26 %
MCH: 30.8 pg (ref 26.6–33.0)
MCHC: 34.2 g/dL (ref 31.5–35.7)
MCV: 90 fL (ref 79–97)
Monocytes Absolute: 1 10*3/uL — ABNORMAL HIGH (ref 0.1–0.9)
Monocytes: 8 %
Neutrophils Absolute: 7.5 10*3/uL — ABNORMAL HIGH (ref 1.4–7.0)
Neutrophils: 59 %
Platelets: 327 10*3/uL (ref 150–450)
RBC: 5.3 x10E6/uL (ref 4.14–5.80)
RDW: 12.3 % (ref 11.6–15.4)
WBC: 12.5 10*3/uL — ABNORMAL HIGH (ref 3.4–10.8)

## 2022-05-12 LAB — ETHANOL

## 2022-05-12 LAB — LEVETIRACETAM LEVEL: Levetiracetam Lvl: 9.5 ug/mL — ABNORMAL LOW (ref 10.0–40.0)

## 2022-05-17 ENCOUNTER — Telehealth: Payer: Self-pay | Admitting: *Deleted

## 2022-08-11 DIAGNOSIS — T161XXA Foreign body in right ear, initial encounter: Secondary | ICD-10-CM | POA: Diagnosis not present

## 2022-08-12 DIAGNOSIS — S00411A Abrasion of right ear, initial encounter: Secondary | ICD-10-CM | POA: Diagnosis not present

## 2022-08-12 DIAGNOSIS — H7291 Unspecified perforation of tympanic membrane, right ear: Secondary | ICD-10-CM | POA: Diagnosis not present

## 2022-08-22 DIAGNOSIS — H7291 Unspecified perforation of tympanic membrane, right ear: Secondary | ICD-10-CM | POA: Diagnosis not present

## 2022-08-22 DIAGNOSIS — S00411D Abrasion of right ear, subsequent encounter: Secondary | ICD-10-CM | POA: Diagnosis not present

## 2022-12-30 ENCOUNTER — Telehealth: Payer: Self-pay | Admitting: Neurology

## 2022-12-30 DIAGNOSIS — D72829 Elevated white blood cell count, unspecified: Secondary | ICD-10-CM | POA: Diagnosis not present

## 2022-12-30 DIAGNOSIS — I10 Essential (primary) hypertension: Secondary | ICD-10-CM | POA: Diagnosis not present

## 2022-12-30 DIAGNOSIS — Z87820 Personal history of traumatic brain injury: Secondary | ICD-10-CM | POA: Diagnosis not present

## 2022-12-30 DIAGNOSIS — R569 Unspecified convulsions: Secondary | ICD-10-CM | POA: Diagnosis not present

## 2022-12-30 DIAGNOSIS — Z1152 Encounter for screening for COVID-19: Secondary | ICD-10-CM | POA: Diagnosis not present

## 2022-12-30 DIAGNOSIS — Z888 Allergy status to other drugs, medicaments and biological substances status: Secondary | ICD-10-CM | POA: Diagnosis not present

## 2022-12-30 DIAGNOSIS — E8809 Other disorders of plasma-protein metabolism, not elsewhere classified: Secondary | ICD-10-CM | POA: Diagnosis not present

## 2022-12-30 DIAGNOSIS — Z79899 Other long term (current) drug therapy: Secondary | ICD-10-CM | POA: Diagnosis not present

## 2022-12-30 DIAGNOSIS — F17210 Nicotine dependence, cigarettes, uncomplicated: Secondary | ICD-10-CM | POA: Diagnosis not present

## 2022-12-30 DIAGNOSIS — J45909 Unspecified asthma, uncomplicated: Secondary | ICD-10-CM | POA: Diagnosis not present

## 2022-12-30 DIAGNOSIS — E872 Acidosis, unspecified: Secondary | ICD-10-CM | POA: Diagnosis not present

## 2022-12-30 NOTE — Telephone Encounter (Signed)
Pt's father, Snyder Bossier pt had a seizure started shaking, lasted a  minute, then he  came out of it. Now pt is alert, no injuries. Pt's father said we are not going to take pt to the emergency room. Would like to early appt than 411/24.   Advise Mr. Dupin to take pt the ED. Mr. Broaden stated, do not want to take him the ED. He has come out of the seizure now and we would have to sit there all day.  Scheduled pt an appt with Dr. Krista Blue.

## 2022-12-31 DIAGNOSIS — G319 Degenerative disease of nervous system, unspecified: Secondary | ICD-10-CM | POA: Diagnosis not present

## 2022-12-31 DIAGNOSIS — E872 Acidosis, unspecified: Secondary | ICD-10-CM | POA: Diagnosis not present

## 2022-12-31 DIAGNOSIS — E8809 Other disorders of plasma-protein metabolism, not elsewhere classified: Secondary | ICD-10-CM | POA: Diagnosis not present

## 2022-12-31 DIAGNOSIS — R569 Unspecified convulsions: Secondary | ICD-10-CM | POA: Diagnosis not present

## 2023-03-02 ENCOUNTER — Ambulatory Visit: Payer: Medicare HMO | Admitting: Neurology

## 2023-05-16 ENCOUNTER — Encounter: Payer: Self-pay | Admitting: Neurology

## 2023-05-16 ENCOUNTER — Ambulatory Visit: Payer: Medicare Other | Admitting: Neurology
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
## 2023-05-24 ENCOUNTER — Telehealth: Payer: Self-pay | Admitting: Neurology

## 2023-05-24 MED ORDER — LEVETIRACETAM ER 500 MG PO TB24: 1500.0000 mg | ORAL_TABLET | Freq: Every day | ORAL | 0 refills | Status: DC

## 2023-05-24 NOTE — Telephone Encounter (Signed)
Pt has r/s his missed appointment and is also on wait list.  Pt states he has been without the levETIRAcetam (KEPPRA XR) 500 MG 24 hr tablet  ([id number]) for about a month, he'd like to get a refill until his upcoming appointment

## 2023-05-24 NOTE — Telephone Encounter (Signed)
Notified pt has enough refilled until his appt.

## 2023-05-24 NOTE — Telephone Encounter (Signed)
Refill sent enough to get to appt please let pt know must make appt to get further refills

## 2023-07-03 ENCOUNTER — Other Ambulatory Visit: Payer: Self-pay | Admitting: Neurology

## 2023-07-04 ENCOUNTER — Encounter: Payer: Self-pay | Admitting: Neurology

## 2023-07-04 ENCOUNTER — Ambulatory Visit: Payer: Medicare Other | Admitting: Neurology

## 2023-07-04 VITALS — BP 122/74 | Ht 68.0 in | Wt 203.0 lb

## 2023-07-04 DIAGNOSIS — G40319 Generalized idiopathic epilepsy and epileptic syndromes, intractable, without status epilepticus: Secondary | ICD-10-CM

## 2023-07-04 MED ORDER — LEVETIRACETAM ER 500 MG PO TB24: 2000.0000 mg | ORAL_TABLET | Freq: Every day | ORAL | 4 refills | Status: DC
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
## 2023-07-04 NOTE — Patient Instructions (Addendum)
Increase Keppra to 4 tablets once daily  Check labs today  Call for any seizures  Recommend against alcohol use Follow up in 1 year   Meds ordered this encounter  Medications   levETIRAcetam (KEPPRA XR) 500 MG 24 hr tablet    Sig: Take 4 tablets (2,000 mg total) by mouth daily.    Dispense:  360 tablet    Refill:  4

## 2024-06-19 LAB — COLOGUARD

## 2024-07-09 ENCOUNTER — Ambulatory Visit: Payer: Medicare Other | Admitting: Neurology

## 2024-07-09 ENCOUNTER — Encounter: Payer: Self-pay | Admitting: Neurology

## 2024-07-09 VITALS — BP 135/83 | HR 93 | Resp 16 | Ht 68.0 in | Wt 215.5 lb

## 2024-07-09 DIAGNOSIS — G40319 Generalized idiopathic epilepsy and epileptic syndromes, intractable, without status epilepticus: Secondary | ICD-10-CM

## 2024-07-09 MED ORDER — LEVETIRACETAM ER 500 MG PO TB24: 2000.0000 mg | ORAL_TABLET | Freq: Every day | ORAL | 4 refills | Status: AC

## 2024-07-09 NOTE — Patient Instructions (Signed)
 Great to see you today.  Continue dose of Keppra .  Check labs.  Do not miss any doses of Keppra .  Call for seizure activity.  Follow-up in 1 year.  Thanks!!
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
## 2024-07-10 ENCOUNTER — Ambulatory Visit: Payer: Self-pay | Admitting: Neurology

## 2024-07-10 ENCOUNTER — Telehealth: Payer: Self-pay | Admitting: Neurology

## 2024-07-10 LAB — COMPREHENSIVE METABOLIC PANEL WITH GFR
ALT: 80 IU/L — ABNORMAL HIGH (ref 0–44)
AST: 50 IU/L — ABNORMAL HIGH (ref 0–40)
Albumin: 4.9 g/dL (ref 3.8–4.9)
Alkaline Phosphatase: 73 IU/L (ref 44–121)
BUN/Creatinine Ratio: 14 (ref 9–20)
BUN: 13 mg/dL (ref 6–24)
Bilirubin Total: 0.3 mg/dL (ref 0.0–1.2)
CO2: 19 mmol/L — ABNORMAL LOW (ref 20–29)
Calcium: 9.7 mg/dL (ref 8.7–10.2)
Chloride: 99 mmol/L (ref 96–106)
Creatinine, Ser: 0.92 mg/dL (ref 0.76–1.27)
Globulin, Total: 2.2 g/dL (ref 1.5–4.5)
Glucose: 81 mg/dL (ref 70–99)
Potassium: 4.2 mmol/L (ref 3.5–5.2)
Sodium: 137 mmol/L (ref 134–144)
Total Protein: 7.1 g/dL (ref 6.0–8.5)
eGFR: 99 mL/min/1.73 (ref >59)

## 2024-07-10 LAB — LEVETIRACETAM LEVEL: Levetiracetam Lvl: 12.9 ug/mL (ref 10.0–40.0)

## 2024-07-10 NOTE — Telephone Encounter (Signed)
 I called the patient, liver enzymes elevated AST 50, ALT 80.  Advised need to be rechecked within a week.  He is leaving for [place] tomorrow.  Tells me he has a doctor who can recheck labs there.  Admits to drinking a lot of energy drinks the last few days, had a difficult time with lab draw was not hydrated.  Denies alcohol use.  Has been taking a lot of ibuprofen .  Advised him to stop energy drinks, over-the-counter medication.  Follow-up with me about the results next week, can have them sent for me to review. Lives in [place] 10 months out of the year.

## 2024-10-22 ENCOUNTER — Ambulatory Visit (INDEPENDENT_AMBULATORY_CARE_PROVIDER_SITE_OTHER)

## 2024-10-22 ENCOUNTER — Ambulatory Visit
Admission: EM | Admit: 2024-10-22 | Discharge: 2024-10-22 | Disposition: A | Discharge status: Home / Self Care | Attending: Internal Medicine | Admitting: Internal Medicine

## 2024-10-22 DIAGNOSIS — R55 Syncope and collapse: Secondary | ICD-10-CM

## 2024-10-22 DIAGNOSIS — R053 Chronic cough: Secondary | ICD-10-CM | POA: Diagnosis not present

## 2024-10-22 MED ORDER — BENZONATATE 100 MG PO CAPS: 100.0000 mg | ORAL_CAPSULE | Freq: Three times a day (TID) | ORAL | 0 refills | Status: AC | PRN

## 2024-10-22 MED ORDER — DOXYCYCLINE HYCLATE 100 MG PO CAPS: 100.0000 mg | ORAL_CAPSULE | Freq: Two times a day (BID) | ORAL | 0 refills | Status: AC

## 2024-10-22 MED ORDER — ALBUTEROL SULFATE HFA 108 (90 BASE) MCG/ACT IN AERS: 1.0000 | INHALATION_SPRAY | Freq: Four times a day (QID) | RESPIRATORY_TRACT | 0 refills | Status: AC | PRN

## 2024-10-22 NOTE — ED Provider Notes (Addendum)
 [facility]    CSN: [id number] Arrival date & time: 10/22/24  1152      History   Chief Complaint Chief Complaint  Patient presents with   Cough   Loss of Consciousness    HPI [name] is a 53 y.o. male.   Patient presents with a 2-week history of coughing.  He is not sure if it is a dry or productive cough.  Denies any associated upper respiratory symptoms or fever.  This is the first time he has been seen since coughing started.  Patient reports that he became concerned given that he has had very harsh coughing fits that have caused him to lose his breath and lose consciousness.  He denies any obvious head injury.  He states that he is out for approximately 20 seconds at a time.  He has had 3 episodes of syncope.  Denies formal diagnosis of asthma or COPD.  He does report previous cigarette smoking. Patient has not taken any medication for his coughing.  Pertinent medical history includes seizures where he takes levetiracetam .  He states that he is absolutely sure that he has not been having seizures as opposed to syncopal episodes.   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 No pertinent surgical history.     Home Medications    Prior to Admission medications   Medication Sig Start Date End Date Taking? Authorizing Provider  albuterol  (VENTOLIN  HFA) 108 (90 Base) MCG/ACT inhaler Inhale 1-2 puffs into the lungs every 6 (six) hours as needed for wheezing or shortness of breath. 10/22/24  Yes Yolandra Habig, Darryle E, FNP  benzonatate (TESSALON) 100 MG capsule Take 1 capsule (100 mg total) by mouth every 8 (eight) hours as needed for cough. 10/22/24  Yes Audris Speaker, Darryle E, FNP  doxycycline (VIBRAMYCIN) 100 MG capsule Take 1 capsule (100 mg total) by mouth 2 (two) times daily for 7 days. 10/22/24 10/29/24 Yes Servando Kyllonen, Darryle BRAVO, FNP  levETIRAcetam  (KEPPRA  XR) 500 MG 24 hr tablet Take 4 tablets (2,000 mg total) by mouth daily. 07/09/24  Yes Gayland Lauraine JINNY, NP  Multiple Vitamin (MULTIVITAMIN) tablet Take 1 tablet by mouth daily.   Yes [provider]    Family History Family History  Problem Relation Age of Onset   Hypotension Mother    Diabetes Mother     Social History Social History   Tobacco Use   Smoking status: Some Days    Current packs/day: 0.25    Average packs/day: 0.3 packs/day for 1 year (0.3 ttl pk-yrs)    Types: Cigarettes   Smokeless tobacco: Never   Tobacco  comments:    09/28/17  5 cigs daily  Vaping Use   Vaping status: Never Used  Substance Use Topics   Alcohol use: No    Alcohol/week: 0.0 standard drinks of alcohol    Comment: stopped 5 years ago   Drug use: No    Types: Benzodiazepines     Allergies   Tramadol   Review of Systems Review of Systems  Cardiovascular:  Positive for syncope.  Per HPI   Physical Exam Triage Vital Signs ED Triage Vitals  Encounter Vitals Group     BP 10/22/24 1209 119/89     Girls Systolic BP Percentile --      Girls Diastolic BP Percentile --      Boys Systolic BP Percentile --      Boys Diastolic BP Percentile --      Pulse Rate 10/22/24 1209 (S) (!) 111      Resp 10/22/24 1209 20     Temp 10/22/24 1209 98.7 F (37.1 C)     Temp Source 10/22/24 1209 Oral     SpO2 10/22/24 1209 (S) 94 %     Weight 10/22/24 1206 215 lb 9.8 oz (97.8 kg)     Height 10/22/24 1206 5' 8.5 (1.74 m)     Head Circumference --      Peak Flow --      Pain Score 10/22/24 1202 0     Pain Loc --      Pain Education --      Exclude from Growth Chart --    No data found.  Updated Vital Signs BP 119/89 (BP Location: Left Arm)   Pulse (S) (!) 109   Temp 98.7 F (37.1 C) (Oral)   Resp (S) 20   Ht 5' 8.5 (1.74 m)   Wt 215 lb 9.8 oz (97.8 kg)   SpO2 (S) 95%   BMI 32.31 kg/m   Visual Acuity Right Eye Distance:   Left Eye Distance:   Bilateral Distance:    Right Eye Near:   Left Eye Near:    Bilateral Near:     Physical Exam Constitutional:      General: He is not in acute distress.    Appearance: Normal appearance. He is not toxic-appearing or diaphoretic.  HENT:     Head: Normocephalic and atraumatic.     Right Ear: Tympanic membrane and ear canal normal.     Left Ear: Tympanic membrane and ear canal normal.     Nose: Nose normal.     Mouth/Throat:     Mouth: Mucous membranes are moist.     Pharynx: No posterior oropharyngeal erythema.  Eyes:     Extraocular Movements: Extraocular movements intact.     Conjunctiva/sclera: Conjunctivae normal.     Pupils: Pupils are equal, round, and reactive to light.  Cardiovascular:     Rate and Rhythm: Normal rate and regular rhythm.     Pulses: Normal pulses.     Heart sounds: Normal heart sounds.  Pulmonary:     Effort: Pulmonary effort is normal. No respiratory distress.     Breath sounds: No stridor. Rhonchi present. No wheezing or rales.     Comments: Harsh coughing noted.  Rhonchi noted bilaterally to auscultation. Musculoskeletal:     Cervical back: Normal range of motion.  Neurological:     General: No focal deficit present.     Mental Status: He is alert and oriented to person, place, and time.  Mental status is at baseline.  Cranial Nerves: Cranial nerves 2-12 are intact.     Sensory: Sensation is intact.     Motor: Motor function is intact.     Coordination: Coordination is intact.     Gait: Gait is intact.  Psychiatric:        Mood and Affect: Mood normal.        Behavior: Behavior normal.        Thought Content: Thought content normal.        Judgment: Judgment normal.      UC Treatments / Results  Labs (all labs ordered are listed, but only abnormal results are displayed) Labs Reviewed  CBC  BASIC METABOLIC PANEL WITH GFR    EKG   Radiology DG Chest 2 View Result Date: 10/22/2024 CLINICAL DATA:  cough EXAM: CHEST - 2 VIEW COMPARISON:  12/30/2022 FINDINGS: No focal airspace consolidation, pleural effusion, or pneumothorax. No cardiomegaly. Tortuous aorta with aortic atherosclerosis. No acute fracture or destructive lesions. Multilevel thoracic osteophytosis. Chronic compression fractures at the thoracolumbar spine with vertebroplasty cement noted IMPRESSION: No acute cardiopulmonary abnormality. Electronically Signed   By: Rogelia Myers M.D.   On: 10/22/2024 13:23    Procedures Procedures (including critical care time)  Medications Ordered in UC Medications - No data to display  Initial Impression / Assessment and Plan / UC Course  I have reviewed the triage vital signs and the nursing notes.  Pertinent labs & imaging results that were available during my care of the patient were reviewed by me and considered in my medical decision making (see chart for details).     Patient most likely has postviral cough versus acute bronchitis. Chest x-ray completed that was negative for any acute cardiopulmonary process.  It did show torturous aorta so advised patient to follow-up with his PCP for this.  There is some chronic compression fractures as well but patient reports this is baseline.  Patient is sitting upright in chair in no acute distress, oxygen is normal,  there is no tachypnea, neuroexam is normal, and he denies head injury so do not think that ER evaluation is necessary.  Patient reports syncopal episodes are due to harsh coughing fits which does sound reasonable.  Will treat with doxycycline given duration of symptoms to cover for any atypical infection especially given coarse lung sounds noted on exam.  Benzonatate prescribed for cough.  Will avoid any narcotic cough medication or cough medication that causes drowsiness so we do not lower the seizure threshold or to cause any additional syncopal episodes.  Patient offered prednisone but adamantly declined prednisone.  Albuterol  inhaler prescribed to take as needed as well.  BMP and CBC pending.  Awaiting results.  Advised patient of strict return and ER precautions and the importance of following up with a PCP.  Patient verbalized understanding and was agreeable with plan. Final Clinical Impressions(s) / UC Diagnoses   Final diagnoses:  Persistent cough  Syncope, unspecified syncope type     Discharge Instructions      I have prescribed you 3 medications to help with symptoms.  Please go to the emergency department if symptoms persist or worsen.  Ensure you are drinking plenty of water.  Blood work is pending.  Follow-up with primary care doctor as well.  You need to follow-up with primary care doctor given chest x-ray result.     ED Prescriptions     Medication Sig Dispense Auth. Provider   doxycycline (VIBRAMYCIN) 100 MG capsule Take 1 capsule (100 mg total) by  mouth 2 (two) times daily for 7 days. 14 capsule Garvin, Rance Smithson E, FNP   benzonatate  (TESSALON ) 100 MG capsule Take 1 capsule (100 mg total) by mouth every 8 (eight) hours as needed for cough. 21 capsule Wood-Ridge, [place] E, FNP   albuterol  (VENTOLIN  HFA) 108 (90 Base) MCG/ACT inhaler Inhale 1-2 puffs into the lungs every 6 (six) hours as needed for wheezing or shortness of breath. 1 each Hazen Darryle BRAVO, [place]      PDMP not reviewed this  encounter.   Hazen Darryle BRAVO, [place] 10/22/24 1351    Hazen Darryle BRAVO, [place] 10/22/24 1352

## 2024-10-22 NOTE — ED Triage Notes (Signed)
 Patient reports symptoms beginning 2 wks ago with slight cough but now more in his chest. Concerns that once every 15-30 mins having coughing fit and not breathing well. Yesterday passed out (for 4 secs) due to this coughing fit and not breathing well during. No fever known. No history of Asthma or COPD.

## 2024-10-22 NOTE — Discharge Instructions (Signed)
 I have prescribed you 3 medications to help with symptoms.  Please go to the emergency department if symptoms persist or worsen.  Ensure you are drinking plenty of water.  Blood work is pending.  Follow-up with primary care doctor as well.  You need to follow-up with primary care doctor given chest x-ray result.

## 2024-10-24 LAB — BASIC METABOLIC PANEL WITH GFR

## 2024-10-24 LAB — CBC
Hematocrit: 48.2 % (ref 37.5–51.0)
Hemoglobin: 15.7 g/dL (ref 13.0–17.7)
MCH: 30.4 pg (ref 26.6–33.0)
MCHC: 32.6 g/dL (ref 31.5–35.7)
MCV: 93 fL (ref 79–97)
Platelets: 312 x10E3/uL (ref 150–450)
RBC: 5.16 x10E6/uL (ref 4.14–5.80)
RDW: 12.8 % (ref 11.6–15.4)
WBC: 21.3 x10E3/uL (ref 3.4–10.8)

## 2024-10-25 ENCOUNTER — Other Ambulatory Visit: Payer: Self-pay

## 2024-10-25 ENCOUNTER — Emergency Department (HOSPITAL_COMMUNITY)

## 2024-10-25 ENCOUNTER — Ambulatory Visit: Payer: Self-pay | Admitting: Internal Medicine

## 2024-10-25 ENCOUNTER — Emergency Department (HOSPITAL_COMMUNITY)
Admission: EM | Admit: 2024-10-25 | Discharge: 2024-10-25 | Disposition: A | Discharge status: Home / Self Care | Source: Ambulatory Visit | Attending: Emergency Medicine | Admitting: Emergency Medicine

## 2024-10-25 DIAGNOSIS — J45909 Unspecified asthma, uncomplicated: Secondary | ICD-10-CM | POA: Insufficient documentation

## 2024-10-25 DIAGNOSIS — R058 Other specified cough: Secondary | ICD-10-CM | POA: Insufficient documentation

## 2024-10-25 DIAGNOSIS — R55 Syncope and collapse: Secondary | ICD-10-CM | POA: Insufficient documentation

## 2024-10-25 DIAGNOSIS — F17210 Nicotine dependence, cigarettes, uncomplicated: Secondary | ICD-10-CM | POA: Insufficient documentation

## 2024-10-25 DIAGNOSIS — I10 Essential (primary) hypertension: Secondary | ICD-10-CM | POA: Insufficient documentation

## 2024-10-25 DIAGNOSIS — D72820 Lymphocytosis (symptomatic): Secondary | ICD-10-CM | POA: Insufficient documentation

## 2024-10-25 DIAGNOSIS — R054 Cough syncope: Secondary | ICD-10-CM | POA: Insufficient documentation

## 2024-10-25 LAB — CBC WITH DIFFERENTIAL/PLATELET
Abs Immature Granulocytes: 0.28 K/uL — ABNORMAL HIGH (ref 0.00–0.07)
Basophils Absolute: 0.2 K/uL — ABNORMAL HIGH (ref 0.0–0.1)
Basophils Relative: 1 %
Eosinophils Absolute: 0.7 K/uL — ABNORMAL HIGH (ref 0.0–0.5)
Eosinophils Relative: 3 %
HCT: 46.9 % (ref 39.0–52.0)
Hemoglobin: 15.8 g/dL (ref 13.0–17.0)
Immature Granulocytes: 1 %
Lymphocytes Relative: 44 %
Lymphs Abs: 10.1 K/uL — ABNORMAL HIGH (ref 0.7–4.0)
MCH: 30.3 pg (ref 26.0–34.0)
MCHC: 33.7 g/dL (ref 30.0–36.0)
MCV: 90 fL (ref 80.0–100.0)
Monocytes Absolute: 1.6 K/uL — ABNORMAL HIGH (ref 0.1–1.0)
Monocytes Relative: 7 %
Neutro Abs: 10 K/uL — ABNORMAL HIGH (ref 1.7–7.7)
Neutrophils Relative %: 44 %
Platelets: 311 K/uL (ref 150–400)
RBC: 5.21 MIL/uL (ref 4.22–5.81)
RDW: 12.9 % (ref 11.5–15.5)
Smear Review: NORMAL
WBC: 22.9 K/uL — ABNORMAL HIGH (ref 4.0–10.5)
nRBC: 0 % (ref 0.0–0.2)

## 2024-10-25 LAB — D-DIMER, QUANTITATIVE: D-Dimer, Quant: 0.39 ug{FEU}/mL (ref 0.00–0.50)

## 2024-10-25 LAB — PROTIME-INR
INR: 0.9 (ref 0.8–1.2)
Prothrombin Time: 13.1 s (ref 11.4–15.2)

## 2024-10-25 LAB — COMPREHENSIVE METABOLIC PANEL WITH GFR
ALT: 92 U/L — ABNORMAL HIGH (ref 0–44)
AST: 50 U/L — ABNORMAL HIGH (ref 15–41)
Albumin: 4.4 g/dL (ref 3.5–5.0)
Alkaline Phosphatase: 75 U/L (ref 38–126)
Anion gap: 13 (ref 5–15)
BUN: 13 mg/dL (ref 6–20)
CO2: 22 mmol/L (ref 22–32)
Calcium: 9.3 mg/dL (ref 8.9–10.3)
Chloride: 103 mmol/L (ref 98–111)
Creatinine, Ser: 0.87 mg/dL (ref 0.61–1.24)
GFR, Estimated: >60 mL/min (ref >60)
Glucose, Bld: 103 mg/dL — ABNORMAL HIGH (ref 70–99)
Potassium: 4.2 mmol/L (ref 3.5–5.1)
Sodium: 138 mmol/L (ref 135–145)
Total Bilirubin: 0.2 mg/dL (ref 0.0–1.2)
Total Protein: 7.4 g/dL (ref 6.5–8.1)

## 2024-10-25 LAB — PATHOLOGIST SMEAR REVIEW

## 2024-10-25 LAB — I-STAT CG4 LACTIC ACID, ED
Lactic Acid, Venous: 2.7 mmol/L (ref 0.5–1.9)
Lactic Acid, Venous: 1.2 mmol/L (ref 0.5–1.9)

## 2024-10-25 MED ORDER — HYDROCODONE BIT-HOMATROP MBR 5-1.5 MG/5ML PO SOLN: 5.0000 mL | Freq: Four times a day (QID) | ORAL | 0 refills | Status: DC | PRN

## 2024-10-25 MED ORDER — SODIUM CHLORIDE 0.9 % IV BOLUS
1000.0000 mL | Freq: Once | INTRAVENOUS | Status: AC
  Administered 2024-10-25: 1000 mL via INTRAVENOUS

## 2024-10-25 NOTE — ED Triage Notes (Signed)
 PT ambulatory to triage with complaints of a cough and fatigue. Pt states that he thought he would improve, but he has not. Pt was seen at urgent care on 12/2, had blood drawn. CBC resulted at 21.3, and pt was told to come to the ER with concern for sepsis.

## 2024-10-25 NOTE — Discharge Instructions (Addendum)
 We evaluated you for your cough and your abnormal laboratory test.  We did not see signs of a pneumonia or any other dangerous cause of your cough.  We suspect that you have a viral infection.  We have prescribed you a stronger cough medication.  Do not drive when you are taking this medication and only take it when necessary as it could be addictive.  Try not to drive until your coughing has resolved so you do not faint while driving.  We noticed a type of your white blood cells (lymphocytes) were elevated.  The laboratory staff recommend obtaining further test called flow cytometry.  We have referred you to a hematologist so that they can help get this test performed.  Please also set up care with a primary doctor.  If you have any new or worsening symptoms such as increased trouble breathing, worsening cough, productive cough, worsening fainting, or any other new symptoms, please return to the emergency department.

## 2024-10-25 NOTE — ED Provider Notes (Signed)
 [place] EMERGENCY DEPARTMENT AT [facility] Provider Note  CSN: [id number] Arrival date & time: 10/25/24 1001  Chief Complaint(s) Abnormal Lab  HPI [name] is a 53 y.o. male history of seizures presenting the emergency department with abnormal lab.  Patient reports he went to urgent care 3 days ago.  He been having persistent cough for the past 4 to 5 days.  Reports that he has had episodes of coughing followed by few episodes of brief fainting.  Denies fevers or chills.  Reports body aches and some mild chest pain with coughing.  No pleuritic pain.  No productive cough.  No nausea vomiting or diarrhea.  No dysuria.  Apparently his WBC count came back as elevated so he was advised to come to the ER by urgent care.  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 Authorizing Provider  HYDROcodone  bit-homatropine (HYCODAN) 5-1.5 MG/5ML syrup Take 5 mLs by mouth every 6 (six) hours as needed for cough. 10/25/24  Yes Francesca Elsie CROME, MD  albuterol  (VENTOLIN  HFA) 108 (90 Base) MCG/ACT inhaler Inhale 1-2  puffs into the lungs every 6 (six) hours as needed for wheezing or shortness of breath. 10/22/24   Hazen Darryle BRAVO, FNP  benzonatate  (TESSALON ) 100 MG capsule Take 1 capsule (100 mg total) by mouth every 8 (eight) hours as needed for cough. 10/22/24   Hazen Darryle BRAVO, FNP  doxycycline  (VIBRAMYCIN ) 100 MG capsule Take 1 capsule (100 mg total) by mouth 2 (two) times daily for 7 days. 10/22/24 10/29/24  Hazen Darryle BRAVO, FNP  levETIRAcetam  (KEPPRA  XR) 500 MG 24 hr tablet Take 4 tablets (2,000 mg total) by mouth daily. 07/09/24   Gayland Lauraine JINNY, NP  Multiple Vitamin (MULTIVITAMIN) tablet Take 1 tablet by mouth daily.    [provider]                                                                                                                                    Past  Surgical History No past surgical history on file. Family History Family History  Problem Relation Age of Onset   Hypotension Mother    Diabetes Mother     Social History Social History   Tobacco Use   Smoking status: Some Days    Current packs/day: 0.25    Average packs/day: 0.3 packs/day for 1 year (0.3 ttl pk-yrs)    Types: Cigarettes   Smokeless tobacco: Never   Tobacco comments:    09/28/17  5 cigs daily  Vaping Use   Vaping status: Never Used  Substance Use Topics   Alcohol use: No    Alcohol/week: 0.0 standard drinks of alcohol    Comment: stopped 5 years ago   Drug use: No    Types: Benzodiazepines   Allergies Tramadol  Review of Systems Review of Systems  All other systems reviewed and are negative.   Physical Exam Vital Signs  I have reviewed the triage vital signs BP (!) 148/97 (BP Location: Right Arm)   Pulse 91   Temp 97.6 F (36.4 C) (Oral)   Resp 19   SpO2 95%  Physical Exam Vitals and nursing note reviewed.  Constitutional:      General: He is not in acute distress.    Appearance: Normal appearance.  HENT:     Mouth/Throat:     Mouth: Mucous membranes are moist.  Eyes:      Conjunctiva/sclera: Conjunctivae normal.  Cardiovascular:     Rate and Rhythm: Regular rhythm. Tachycardia present.  Pulmonary:     Effort: Pulmonary effort is normal. No respiratory distress.     Breath sounds: Normal breath sounds.     Comments: Frequent cough Abdominal:     General: Abdomen is flat.     Palpations: Abdomen is soft.     Tenderness: There is no abdominal tenderness.  Musculoskeletal:     Right lower leg: No edema.     Left lower leg: No edema.  Skin:    General: Skin is warm and dry.     Capillary Refill: Capillary refill takes less than 2 seconds.  Neurological:     Mental Status: He is alert and oriented to person, place, and time. Mental status is at baseline.  Psychiatric:        Mood and Affect: Mood normal.        Behavior: Behavior normal.     ED Results and Treatments Labs (all labs ordered are listed, but only abnormal results are displayed) Labs Reviewed  COMPREHENSIVE METABOLIC PANEL WITH GFR - Abnormal; Notable for the following components:      Result Value   Glucose, Bld 103 (*)    AST 50 (*)    ALT 92 (*)    All other components within normal limits  CBC WITH DIFFERENTIAL/PLATELET - Abnormal; Notable for the following components:   WBC 22.9 (*)    Neutro Abs 10.0 (*)    Lymphs Abs 10.1 (*)    Monocytes Absolute 1.6 (*)    Eosinophils Absolute 0.7 (*)    Basophils Absolute 0.2 (*)    Abs Immature Granulocytes 0.28 (*)    All other components within normal limits  I-STAT CG4 LACTIC ACID, ED - Abnormal; Notable for the following components:   Lactic Acid, Venous 2.7 (*)    All other components within normal limits  CULTURE, BLOOD (ROUTINE X 2)  CULTURE, BLOOD (ROUTINE X 2)  PROTIME-INR  D-DIMER, QUANTITATIVE  PATHOLOGIST SMEAR REVIEW  I-STAT CG4 LACTIC ACID, ED  Radiology CT Chest Wo Contrast Result Date:  10/25/2024 CLINICAL DATA:  Respiratory illness, nondiagnostic xray EXAM: CT CHEST WITHOUT CONTRAST TECHNIQUE: Multidetector CT imaging of the chest was performed following the standard protocol without IV contrast. RADIATION DOSE REDUCTION: This exam was performed according to the departmental dose-optimization program which includes automated exposure control, adjustment of the mA and/or kV according to patient size and/or use of iterative reconstruction technique. COMPARISON:  10/25/2024, 10/22/2024, 01/04/2012 FINDINGS: Cardiovascular: No cardiomegaly. Small pericardial effusion, unchanged. No aortic aneurysm. calcified multi-vessel coronary artery atherosclerosis. Mediastinum/Nodes: No mediastinal mass.Small hiatal hernia.No mediastinal, hilar, or axillary lymphadenopathy. Lungs/Pleura: The midline trachea and bronchi are patent. No focal airspace consolidation, pleural effusion, or pneumothorax. Subsegmental atelectasis in the lingula with posterior bibasilar dependent atelectasis. Small calcified granuloma in the left lower lobe. Musculoskeletal: No acute fracture or destructive bone lesion. Multilevel degenerative disc disease of the spine. Vertebroplasty cement within the L2 vertebral body. Moderate age-indeterminate compression deformity of the L1 vertebral body with a mild compression deformity of T12. Multiple large superior endplate Schmorl's nodes within the mid to upper thoracic spine. Upper Abdomen: No acute abnormality in the partially visualized upper abdomen. Diffuse hepatic steatosis. IMPRESSION: 1. No acute intrathoracic abnormality. Specifically, no pneumonia, pulmonary edema, or pleural effusion. 2. Age-indeterminate, moderate compression deformity of L1 with a mild compression deformity of T12. While these may represent chronic compression fractures, correlation with point tenderness is recommended to assess for acuity. 3. Multi-vessel coronary artery atherosclerosis. Electronically Signed    By: Rogelia Myers M.D.   On: 10/25/2024 13:40   DG Chest 2 View Result Date: 10/25/2024 CLINICAL DATA:  Possible sepsis EXAM: CHEST - 2 VIEW COMPARISON:  Prior chest x-ray 10/22/2024 FINDINGS: Cardiac and mediastinal contours are within normal limits. Linear airspace opacities in the lingula consistent with atelectasis. No focal airspace opacity to suggest pneumonia. No pleural effusion or pneumothorax. Surgical changes of prior L2 cement augmentation. Chronic compression deformity at L1. IMPRESSION: No active cardiopulmonary disease. Electronically Signed   By: Wilkie Lent M.D.   On: 10/25/2024 11:47    Pertinent labs & imaging results that were available during my care of the patient were reviewed by me and considered in my medical decision making (see MDM for details).  Medications Ordered in ED Medications  sodium chloride  0.9 % bolus 1,000 mL (0 mLs Intravenous Stopped 10/25/24 1528)                                                                                                                                     Procedures Procedures  (including critical care time)  Medical Decision Making / ED Course   MDM:  53 year old presenting to the emergency department with abnormal lab.  Physical examination overall reassuring, notable for borderline tachycardia, lungs clear.  Symptoms seem most consistent with paroxysmal coughing spells with episode of cough syncope.  Has been taking Tessalon  but having some persistent cough.  Was also prescribed doxycycline .  Did have leukocytosis at urgent care, labs were drawn 3 days ago, will repeat today.  Patient not having fevers, hypotension or symptoms of sepsis.  Will obtain repeat chest x-ray.  Will check other labs as well.  Given mild tachycardia I will check D-dimer.  Will reassess.    Clinical Course as of 10/25/24 1614  Fri Oct 25, 2024  1607 Repeat lactate is normal.  Pathology smear of WBCs was performed and they recommend  patient have flow cytometry.  Seems less consistent with leukocytosis due to underlying infection. Overall concern for underlying bacterial infection is low, but cultures have been sent. Patient has been referred to hematology for further workup.  CT chest is obtained given negative x-ray with no evidence of underlying pneumonia.  Will treat cough given his paroxysmal coughing spells, reviewed PDMP, discussed possible addictive nature of substance.  Advise not to drive when taking this medicine.  Recommended establishing care with primary doctor.  Given some syncopal episodes advised to avoid driving until his cough resolves. Will discharge patient to home. All questions answered. Patient comfortable with plan of discharge. Return precautions discussed with patient and specified on the after visit summary.  [WS]    Clinical Course User Index [WS] Francesca Elsie CROME, MD     Additional history obtained: -External records from outside source obtained and reviewed including: Chart review including previous notes, labs, imaging, consultation notes including UC note, UC labs    Lab Tests: -I ordered, reviewed, and interpreted labs.   The pertinent results include:   Labs Reviewed  COMPREHENSIVE METABOLIC PANEL WITH GFR - Abnormal; Notable for the following components:      Result Value   Glucose, Bld 103 (*)    AST 50 (*)    ALT 92 (*)    All other components within normal limits  CBC WITH DIFFERENTIAL/PLATELET - Abnormal; Notable for the following components:   WBC 22.9 (*)    Neutro Abs 10.0 (*)    Lymphs Abs 10.1 (*)    Monocytes Absolute 1.6 (*)    Eosinophils Absolute 0.7 (*)    Basophils Absolute 0.2 (*)    Abs Immature Granulocytes 0.28 (*)    All other components within normal limits  I-STAT CG4 LACTIC ACID, ED - Abnormal; Notable for the following components:   Lactic Acid, Venous 2.7 (*)    All other components within normal limits  CULTURE, BLOOD (ROUTINE X 2)  CULTURE, BLOOD  (ROUTINE X 2)  PROTIME-INR  D-DIMER, QUANTITATIVE  PATHOLOGIST SMEAR REVIEW  I-STAT CG4 LACTIC ACID, ED    Notable for lymphocytosis, initial mild lactic acidosis, improved on recheck   EKG   EKG Interpretation Date/Time:  Friday October 25 2024 11:15:37 EST Ventricular Rate:  104 PR Interval:  149 QRS Duration:  93 QT Interval:  341 QTC Calculation: 449 R Axis:   71  Text Interpretation: Sinus tachycardia Confirmed by Francesca Elsie ([id number]) on 10/25/2024 11:41:47 AM         Imaging Studies ordered: I ordered imaging studies including CT chest On my interpretation imaging demonstrates no pneumonia  I independently visualized and interpreted imaging. I agree with the radiologist interpretation   Medicines ordered and prescription drug management: Meds ordered this encounter  Medications   sodium chloride  0.9 % bolus 1,000 mL   HYDROcodone  bit-homatropine (HYCODAN) 5-1.5 MG/5ML syrup    Sig: Take 5 mLs by mouth every 6 (six) hours as needed for cough.    Dispense:  120 mL    Refill:  0    -I have reviewed the patients home medicines and have made adjustments as needed   Social Determinants of Health:  Diagnosis or treatment significantly limited by social determinants of health: obesity   Reevaluation: After the interventions noted above, I reevaluated the patient and found that their symptoms have improved  Co morbidities that complicate the patient evaluation  Past Medical History:  Diagnosis Date   Anxiety    Asthma    Chronic back pain    Depression    Hypertension    Seizure (HCC)    Suicidal behavior       Dispostion: Disposition decision including need for hospitalization was considered, and patient discharged from emergency department.    Final Clinical Impression(s) / ED Diagnoses Final diagnoses:  Lymphocytosis  Paroxysmal cough  Cough syncope     This chart was dictated using voice recognition software.  Despite best efforts to  proofread,  errors can occur which can change the documentation meaning.    Francesca Elsie CROME, MD 10/25/24 [phone]

## 2024-10-30 ENCOUNTER — Other Ambulatory Visit: Payer: Self-pay

## 2024-10-30 ENCOUNTER — Emergency Department (HOSPITAL_COMMUNITY)
Admission: EM | Admit: 2024-10-30 | Discharge: 2024-10-30 | Disposition: A | Discharge status: Home / Self Care | Attending: Emergency Medicine | Admitting: Emergency Medicine

## 2024-10-30 ENCOUNTER — Emergency Department (HOSPITAL_COMMUNITY)

## 2024-10-30 ENCOUNTER — Telehealth: Payer: Self-pay | Admitting: Pulmonary Disease

## 2024-10-30 ENCOUNTER — Encounter (HOSPITAL_COMMUNITY): Payer: Self-pay

## 2024-10-30 DIAGNOSIS — R55 Syncope and collapse: Secondary | ICD-10-CM | POA: Insufficient documentation

## 2024-10-30 DIAGNOSIS — S0181XA Laceration without foreign body of other part of head, initial encounter: Secondary | ICD-10-CM

## 2024-10-30 DIAGNOSIS — W228XXA Striking against or struck by other objects, initial encounter: Secondary | ICD-10-CM | POA: Insufficient documentation

## 2024-10-30 DIAGNOSIS — S01112A Laceration without foreign body of left eyelid and periocular area, initial encounter: Secondary | ICD-10-CM | POA: Diagnosis not present

## 2024-10-30 DIAGNOSIS — J440 Chronic obstructive pulmonary disease with acute lower respiratory infection: Secondary | ICD-10-CM

## 2024-10-30 DIAGNOSIS — I10 Essential (primary) hypertension: Secondary | ICD-10-CM | POA: Insufficient documentation

## 2024-10-30 DIAGNOSIS — R054 Cough syncope: Secondary | ICD-10-CM

## 2024-10-30 DIAGNOSIS — Z7951 Long term (current) use of inhaled steroids: Secondary | ICD-10-CM | POA: Diagnosis not present

## 2024-10-30 DIAGNOSIS — F17210 Nicotine dependence, cigarettes, uncomplicated: Secondary | ICD-10-CM | POA: Diagnosis not present

## 2024-10-30 DIAGNOSIS — J40 Bronchitis, not specified as acute or chronic: Secondary | ICD-10-CM | POA: Insufficient documentation

## 2024-10-30 DIAGNOSIS — S0093XA Contusion of unspecified part of head, initial encounter: Secondary | ICD-10-CM

## 2024-10-30 DIAGNOSIS — R000 Tachycardia, unspecified: Secondary | ICD-10-CM | POA: Insufficient documentation

## 2024-10-30 DIAGNOSIS — S0990XA Unspecified injury of head, initial encounter: Secondary | ICD-10-CM | POA: Diagnosis present

## 2024-10-30 DIAGNOSIS — J209 Acute bronchitis, unspecified: Secondary | ICD-10-CM | POA: Diagnosis not present

## 2024-10-30 LAB — RESPIRATORY PANEL BY PCR

## 2024-10-30 LAB — COMPREHENSIVE METABOLIC PANEL WITH GFR
ALT: 80 U/L — ABNORMAL HIGH (ref 0–44)
AST: 40 U/L (ref 15–41)
Albumin: 4.4 g/dL (ref 3.5–5.0)
Alkaline Phosphatase: 83 U/L (ref 38–126)
Anion gap: 13 (ref 5–15)
BUN: 16 mg/dL (ref 6–20)
CO2: 23 mmol/L (ref 22–32)
Calcium: 9.3 mg/dL (ref 8.9–10.3)
Chloride: 105 mmol/L (ref 98–111)
Creatinine, Ser: 0.8 mg/dL (ref 0.61–1.24)
GFR, Estimated: >60 mL/min (ref >60)
Glucose, Bld: 92 mg/dL (ref 70–99)
Potassium: 4.1 mmol/L (ref 3.5–5.1)
Sodium: 140 mmol/L (ref 135–145)
Total Bilirubin: 0.2 mg/dL (ref 0.0–1.2)
Total Protein: 7.3 g/dL (ref 6.5–8.1)

## 2024-10-30 LAB — CBC
HCT: 46.2 % (ref 39.0–52.0)
Hemoglobin: 15.9 g/dL (ref 13.0–17.0)
MCH: 30.4 pg (ref 26.0–34.0)
MCHC: 34.4 g/dL (ref 30.0–36.0)
MCV: 88.3 fL (ref 80.0–100.0)
Platelets: 287 K/uL (ref 150–400)
RBC: 5.23 MIL/uL (ref 4.22–5.81)
RDW: 12.8 % (ref 11.5–15.5)
WBC: 17.5 K/uL — ABNORMAL HIGH (ref 4.0–10.5)
nRBC: 0 % (ref 0.0–0.2)

## 2024-10-30 LAB — SEDIMENTATION RATE: Sed Rate: 2 mm/h (ref 0–16)

## 2024-10-30 LAB — CULTURE, BLOOD (ROUTINE X 2)
Culture: NO GROWTH
Special Requests: ADEQUATE

## 2024-10-30 LAB — D-DIMER, QUANTITATIVE: D-Dimer, Quant: 0.38 ug{FEU}/mL (ref 0.00–0.50)

## 2024-10-30 LAB — C-REACTIVE PROTEIN: CRP: 0.6 mg/dL (ref <1.0)

## 2024-10-30 LAB — CBG MONITORING, ED: Glucose-Capillary: 96 mg/dL (ref 70–99)

## 2024-10-30 MED ORDER — AZITHROMYCIN 250 MG PO TABS: ORAL_TABLET | ORAL | 0 refills | Status: AC

## 2024-10-30 MED ORDER — IPRATROPIUM-ALBUTEROL 0.5-2.5 (3) MG/3ML IN SOLN
3.0000 mL | Freq: Once | RESPIRATORY_TRACT | Status: AC
  Administered 2024-10-30: 3 mL via RESPIRATORY_TRACT
  Filled 2024-10-30: qty 3

## 2024-10-30 MED ORDER — DOXYCYCLINE HYCLATE 100 MG PO CAPS: 100.0000 mg | ORAL_CAPSULE | Freq: Two times a day (BID) | ORAL | 0 refills | Status: DC

## 2024-10-30 MED ORDER — METHYLPREDNISOLONE SODIUM SUCC 125 MG IJ SOLR
125.0000 mg | Freq: Once | INTRAMUSCULAR | Status: AC
  Administered 2024-10-30: 125 mg via INTRAVENOUS
  Filled 2024-10-30: qty 2

## 2024-10-30 MED ORDER — AEROCHAMBER PLUS FLO-VU MEDIUM MISC
1.0000 | Freq: Once | Status: AC
  Administered 2024-10-30: 1

## 2024-10-30 MED ORDER — HYDROCODONE-ACETAMINOPHEN 5-325 MG PO TABS: 1.0000 | ORAL_TABLET | Freq: Four times a day (QID) | ORAL | 0 refills | Status: DC | PRN

## 2024-10-30 MED ORDER — LIDOCAINE-EPINEPHRINE (PF) 2 %-1:200000 IJ SOLN
10.0000 mL | Freq: Once | INTRAMUSCULAR | Status: AC
  Administered 2024-10-30: 10 mL
  Filled 2024-10-30: qty 20

## 2024-10-30 MED ORDER — PREDNISONE 20 MG PO TABS: ORAL_TABLET | ORAL | 0 refills | Status: AC

## 2024-10-30 MED ORDER — BUDESONIDE-FORMOTEROL FUMARATE 80-4.5 MCG/ACT IN AERO: 2.0000 | INHALATION_SPRAY | Freq: Two times a day (BID) | RESPIRATORY_TRACT | 1 refills | Status: AC

## 2024-10-30 NOTE — ED Provider Notes (Signed)
 [place] EMERGENCY DEPARTMENT AT [facility] Provider Note   CSN: [id number] Arrival date & time: 10/30/24  9074     Patient presents with: Cough   [name] is a 53 y.o. male.   HPI Reports he had a cough for about 10 days or more.  Cough is mostly been dry.  Patient has severe coughing episodes that are causing him to pass out.  Now he has had multiple episodes of loss of consciousness when he starts coughing.  Today he came in because he had gotten out in the driveway started coughing and then passed out and hit his head.  He has a laceration above the brow.  Patient reports that with these episodes usually he can tell is going to come on so he has been able to get sitting or lying down and then has a brief about 10-second loss of consciousness and then when he wakes back up he recalls the events etc.  He reports last night, this happened in his house and he ended up falling onto his knee and having a really sore right knee as well.  He has been to the emergency department previously about 5 days ago and had CT chest imaging done.  He was prescribed doxycycline  from urgent care about a week ago and is on his last day today.  He does have a hydrocodone  cough suppressant but it does not seem to be changing the symptoms much.  Patient denies a prior history of significant syncopal or frequent syncopal episodes.  He is not experiencing chest pain.  Patient's father-in did test positive for COVID about a week ago.  He reports at baseline he is healthy and active and does not have much for medical problems.    Prior to Admission medications   Medication Sig Start Date End Date Taking? Authorizing Provider  budesonide -formoterol  (SYMBICORT ) 80-4.5 MCG/ACT inhaler Inhale 2 puffs into the lungs 2 (two) times daily. 10/30/24  Yes Armenta Canning, MD  doxycycline  (VIBRAMYCIN ) 100 MG capsule Take 1 capsule (100 mg total) by mouth 2 (two) times daily. 10/30/24  Yes Armenta Canning, MD   HYDROcodone -acetaminophen  (NORCO/VICODIN) 5-325 MG tablet Take 1-2 tablets by mouth every 6 (six) hours as needed. 10/30/24  Yes Armenta Canning, MD  predniSONE (DELTASONE) 20 MG tablet 2 tabs po daily x 4 days 10/30/24  Yes Duha Abair, Canning, MD  albuterol  (VENTOLIN  HFA) 108 (90 Base) MCG/ACT inhaler Inhale 1-2 puffs into the lungs every 6 (six) hours as needed for wheezing or shortness of breath. 10/22/24   Hazen Darryle BRAVO, FNP  benzonatate  (TESSALON ) 100 MG capsule Take 1 capsule (100 mg total) by mouth every 8 (eight) hours as needed for cough. 10/22/24   Hazen Darryle BRAVO, FNP  HYDROcodone  bit-homatropine (HYCODAN) 5-1.5 MG/5ML syrup Take 5 mLs by mouth every 6 (six) hours as needed for cough. 10/25/24   Francesca Elsie CROME, MD  levETIRAcetam  (KEPPRA  XR) 500 MG 24 hr tablet Take 4 tablets (2,000 mg total) by mouth daily. 07/09/24   Gayland Lauraine JINNY, NP  Multiple Vitamin (MULTIVITAMIN) tablet Take 1 tablet by mouth daily.    [provider]    Allergies: Tramadol    Review of Systems  Updated Vital Signs BP (!) 156/93 (BP Location: Right Arm)   Pulse (!) 102   Temp 98.5 F (36.9 C) (Oral)   Resp 16   Ht 5' 8 (1.727 m)   Wt 97.5 kg   SpO2 95%   BMI 32.69 kg/m  Physical Exam Constitutional:      Comments: Alert nontoxic clear mental status.  No respiratory distress at rest.  HENT:     Head:     Comments: Patient has a brow laceration above the left brow approximately 2 cm.  Mild gaping.  Associated brow hematoma.    Nose: Nose normal.     Mouth/Throat:     Mouth: Mucous membranes are moist.     Pharynx: Oropharynx is clear.  Eyes:     Extraocular Movements: Extraocular movements intact.     Pupils: Pupils are equal, round, and reactive to light.  Cardiovascular:     Comments: Mild tachycardia no rub murmur gallop. Pulmonary:     Comments: No respiratory distress at rest.  While patient does start coughing however, he goes into a paroxysmal starts feeling lightheaded and  heart rate goes up to 120s.  When relaxed there is some coarse central breath sounds. Abdominal:     General: There is no distension.     Palpations: Abdomen is soft.     Tenderness: There is no abdominal tenderness. There is no guarding.  Musculoskeletal:        General: Normal range of motion.     Cervical back: Neck supple.     Right lower leg: No edema.     Left lower leg: No edema.     Comments: No peripheral edema.  Bilateral calves soft and nontender.  Skin:    General: Skin is warm and dry.  Neurological:     General: No focal deficit present.     Mental Status: He is oriented to person, place, and time.     Motor: No weakness.     Coordination: Coordination normal.  Psychiatric:        Mood and Affect: Mood normal.     (all labs ordered are listed, but only abnormal results are displayed) Labs Reviewed  COMPREHENSIVE METABOLIC PANEL WITH GFR - Abnormal; Notable for the following components:      Result Value   ALT 80 (*)    All other components within normal limits  CBC - Abnormal; Notable for the following components:   WBC 17.5 (*)    All other components within normal limits  BORDETELLA PERTUSSIS PCR  RESPIRATORY PANEL BY PCR  D-DIMER, QUANTITATIVE  SEDIMENTATION RATE  URINALYSIS, ROUTINE W REFLEX MICROSCOPIC  C-REACTIVE PROTEIN  CBG MONITORING, ED    EKG: None  Radiology: CT Head Wo Contrast Result Date: 10/30/2024 CLINICAL DATA:  Head trauma, fall, syncopal episode, scalp laceration EXAM: CT HEAD WITHOUT CONTRAST TECHNIQUE: Contiguous axial images were obtained from the base of the skull through the vertex without intravenous contrast. RADIATION DOSE REDUCTION: This exam was performed according to the departmental dose-optimization program which includes automated exposure control, adjustment of the mA and/or kV according to patient size and/or use of iterative reconstruction technique. 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 No acute intracranial hemorrhage, mass lesion, new infarction, midline shift, herniation, hydrocephalus, or extra-axial fluid collection. No focal mass effect or edema. Cisterns are patent. Cerebellar atrophy as well. Vascular: No hyperdense vessel or unexpected calcification. Skull: Normal. Negative for fracture or focal lesion. Sinuses/Orbits: Chronic scattered sinus mucosal thickening predominately in the ethmoid and maxillary sinuses. Mastoids are clear. No sinus air-fluid level. Orbits are symmetric. Other: Left frontal orbital soft tissue swelling/bruising noted. IMPRESSION: 1. 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 2. No acute intracranial  abnormality by noncontrast CT. 3. Left frontal orbital soft tissue swelling/bruising. Electronically Signed   By: CHRISTELLA.  Shick M.D.   On: 10/30/2024 15:21     .Laceration Repair  Date/Time: 10/30/2024 4:15 PM  Performed by: Armenta Canning, MD Authorized by: Armenta Canning, MD   Consent:    Consent obtained:  Verbal   Consent given by:  Patient   Risks discussed:  Infection and pain Anesthesia:    Anesthesia method:  Local infiltration   Local anesthetic:  Lidocaine  2% WITH epi Laceration details:    Location:  Face   Face location:  L eyebrow   Length (cm):  1.5   Depth (mm):  5 Pre-procedure details:    Preparation:  Patient was prepped and draped in usual sterile fashion Exploration:    Wound extent: areolar tissue violated     Contaminated: no   Treatment:    Area cleansed with:  Saline and Shur-Clens   Amount of cleaning:  Standard Skin repair:    Repair method:  Sutures   Suture size:  5-0   Suture material:  Nylon   Suture technique:  Simple interrupted   Number of sutures:  4 Approximation:    Approximation:  Close Repair type:    Repair type:  Simple    Medications Ordered in the ED  AeroChamber Plus Flo-Vu Medium MISC 1 each (has no administration  in time range)  ipratropium-albuterol  (DUONEB) 0.5-2.5 (3) MG/3ML nebulizer solution 3 mL (3 mLs Nebulization Given 10/30/24 1157)  lidocaine -EPINEPHrine  (XYLOCAINE  W/EPI) 2 %-1:200000 (PF) injection 10 mL (10 mLs Infiltration Given 10/30/24 1157)  ipratropium-albuterol  (DUONEB) 0.5-2.5 (3) MG/3ML nebulizer solution 3 mL (3 mLs Nebulization Given 10/30/24 1517)  methylPREDNISolone  sodium succinate (SOLU-MEDROL ) 125 mg/2 mL injection 125 mg (125 mg Intravenous Given 10/30/24 1517)                                    Medical Decision Making Amount and/or Complexity of Data Reviewed Labs: ordered. Radiology: ordered.  Risk Prescription drug management.   Patient presents as outlined.  He has been having recurrent episodes of cough induced syncope.  Patient's cough has persisted now for about 10 days.  He is not seeing any improvement.  He had an episode today and hit his head creating a brow laceration.  Patient did have a CT imaging done earlier in the week without acute findings of consolidation or mass.  Will proceed with diagnostic evaluation for persisting cough and syncope.  Presence of metabolic panel normal except ALT of 80.  White count 17.5.  Sed rate 2.  D-dimer 0.38.  CT head no acute findings except soft tissue swelling of forehead.  Stable atrophy and remote left infarct noted.  I reviewed this with the patient.  He reports that he had multiple concussions in sports over the years and is successfully treated for the seizure with Keppra .  Consult: Patient was seen by pulmonology Dr.Alghanim for persisting severe cough in the setting of otherwise healthy individual treated and experiencing recurrent syncopes.  Recommendation is for Symbicort  twice daily prednisone for 5 days Zithromycin Dosepak continued albuterol .  Patient's cough improved with 2 DuoNeb's.  There was a proved distal airflow.  Patient did not wish to have admission for syncope.  At this time it does not appear this  is entirely cough induced.  It has been insistently in the setting of harsh cough paroxysm.  Patient is  aware that he cannot be driving or doing any kinds of activities that could result in injury.  He advises he will be staying at home and resting and taking his medications.  Careful return precautions and follow-up recommendations have been made.      Final diagnoses:  Bronchitis  Cough syncope  Facial laceration, initial encounter    ED Discharge Orders          Ordered    budesonide -formoterol  (SYMBICORT ) 80-4.5 MCG/ACT inhaler  2 times daily        10/30/24 1611    predniSONE (DELTASONE) 20 MG tablet        10/30/24 1611    HYDROcodone -acetaminophen  (NORCO/VICODIN) 5-325 MG tablet  Every 6 hours PRN        10/30/24 1611    doxycycline  (VIBRAMYCIN ) 100 MG capsule  2 times daily        10/30/24 1611               Armenta Canning, MD 10/30/24 1627

## 2024-10-30 NOTE — ED Notes (Signed)
 Pt brought back from CT.

## 2024-10-30 NOTE — Discharge Instructions (Addendum)
 1.  Ideally should have a follow-up appointment within the next 5 to 7 days.  I do see your family doctor.  I have included an ambulatory referral to the cardiology office for passing out episodes. 2.  Fill your prescriptions and take zithromax for 5 more days.  Start taking prednisone today as prescribed.  Use the budesonide  inhaler (Symbicort ) twice daily at the same time each day.  Use the albuterol  inhaler every 2-4 hours as needed.  For the next couple of days use this every 4-6 hours until your cough and symptoms are improving. 3.  No driving.  Rest and stay at home for the next 3 to 4 days.  If your symptoms are continuing to persist or worsening, return to the emergency department for recheck. 4.  Your stitches should come out in 7 days.  You may return to an urgent care or emergency department or your family doctor.

## 2024-10-30 NOTE — ED Triage Notes (Addendum)
 Patient said had a coughing fit and then blacked out for 10 seconds. Woke up on the ground. Did a face plant on the cement. Laceration to left eyebrow. No blood thinners. 1 inch laceration, bleeding controlled. Last tetanus 9 years ago.

## 2024-10-30 NOTE — Consult Note (Signed)
 NAME:  [name], MRN:  [id number], DOB:  06-30-1971, LOS: 0 ADMISSION DATE:  10/30/2024, CONSULTATION DATE:  10/30/24 REFERRING MD:  Dr. Armenta, CHIEF COMPLAINT:  cough and syncope   History of Present Illness:  The patient is a 53 year old man with a history of seizures who presents with persistent cough and episodes of syncope.  He has been experiencing a persistent cough for the past ten days, causing significant distress. The cough occurs randomly, often triggered by deep breaths or exposure to cold air, and has recently started to produce wheezing. Initially dry, the cough is now starting to 'break up,' possibly due to doxycycline  treatment. He has been using an albuterol  inhaler once daily, which he finds helpful, but not as frequently as prescribed. He was also prescribed hydrocodone  cough syrup, taken twice daily, but is unsure of its effectiveness in reducing the cough.  The cough has led to episodes where he cannot breathe, becomes dizzy, and sometimes loses consciousness, resulting in falls. He describes these episodes as 'fuzzing out' and sometimes losing consciousness for a few seconds. He has fallen multiple times, including a recent fall on concrete, resulting in facial bruising and a headache. He reports losing consciousness three to four times a day over the past ten days.  He has a history of seizures, managed with Keppra  (Levitra), but differentiates these episodes from his typical seizures, noting that the current episodes are associated with coughing and not the full-body convulsions he experiences during seizures.  He has a past medical history of mild allergies, possibly to dust, identified during childhood. He has no history of asthma or significant respiratory issues prior to this episode. He has a history of smoking, which he quit ten days ago due to the cough. He has no known drug allergies except for tramadol, which he was advised to avoid in the past.  His father  was diagnosed with COVID-19 recently, and he has been in close contact with him. He has not experienced fever, chills, or night sweats, but he has had a sinus infection with post-nasal drip, which he suspects may have contributed to his current respiratory symptoms.  Pertinent  Medical History  Seizures Allergic Rhinitis Nicotine  dependence Objective    Blood pressure (!) 156/93, pulse (!) 102, temperature 98.5 F (36.9 C), temperature source Oral, resp. rate 16, height 5' 8 (1.727 m), weight 97.5 kg, SpO2 95%.       No intake or output data in the 24 hours ending 10/30/24 1425 Filed Weights   10/30/24 0930  Weight: 97.5 kg    Examination: General: overall well appearing, no distress HENT: large bruise on left eye with swelling of the eye lid, laceration with stitching of left brow, ecchymoses around left periorbital area Lungs: rhonchi on inspiration in R lung base with expiratory wheezing, coughs with deep breathing Cardiovascular: tachycardic, no audible murmurs Abdomen: soft, non-tender Extremities: bruising noted in R knee and shin Neuro: A&O x 3, PERRL, no focal defecits GU: deferred  Resolved problem list   Assessment and Plan   Acute bronchitis with cough-induced syncope Acute bronchitis with persistent cough leading to syncope. Likely viral etiology, possibly COVID-19 due to recent exposure. Previous treatments included doxycycline  and albuterol  with partial relief. Current symptoms suggest ongoing inflammation and bronchial irritation. CT Chest on 10/25/2024 shows evidence of bronchial wall thickening suggestive of bronchitis. - Prescribe Symbicort , two puffs twice daily or other inhalers with ICS/LABA - Prescribe prednisone for 5 days to reduce lung inflammation. - Prescribe  azithromycin dose pack - Continue albuterol  as needed for wheezing. - Ordered viral panel to test for COVID-19. - Advised against driving until symptoms improve. - Likely needs PCP and  pulmonology follow up outpatient  Post-tussive Syncope: Likely related to acute bronchitis episode. The patient is having 3-4 syncopal episodes per day in the last 10 days. I discussed with him getting admitted for observation given significant trauma as a result of syncope. The patient declined and noted that he would be careful and take it easy the next few days. He said he will not drive as well. For now, will treat bronchitis episode and I agree with cough syrup with narcotic medication such as codeine to reduce his coughing and thus decrease syncopal episodes. Cough syncope or laryngeal ictus is characteristic of his symptoms including paroxysmal cough, facial congestion, and loss of consciousness that usually lasts a few seconds and recovers rapidly. He does have a history of seizure but the fact that he recovers quickly without an ictal period speaks against that. The patient is compliant to his AED.   Nicotine  dependence Currently abstinent due to cough exacerbation. Smoking cessation is crucial to prevent further respiratory complications. - Continue smoking cessation.  R knee trauma Recent fall resulted in right knee trauma with significant pain and bruising. No fracture suspected. - Recommend x-ray of right knee to rule out fracture.  Head contusion secondary to fall Head contusion from fall due to syncope. Reports headache and bruising. Risk of intracranial bleeding considered. - Recommend head CT to rule out intracranial bleeding.  Patient Lines/Drains/Airways Status     Active Line/Drains/Airways     Name Placement date Placement time Site Days   Peripheral IV 10/30/24 20 G Anterior;Left Forearm 10/30/24  0957  Forearm  less than 1            Labs   CBC: Recent Labs  Lab 10/25/24 1146 10/30/24 0954  WBC 22.9* 17.5*  NEUTROABS 10.0*  --   HGB 15.8 15.9  HCT 46.9 46.2  MCV 90.0 88.3  PLT 311 287    Basic Metabolic Panel: Recent Labs  Lab 10/25/24 1146  10/30/24 0954  NA 138 140  K 4.2 4.1  CL 103 105  CO2 22 23  GLUCOSE 103* 92  BUN 13 16  CREATININE 0.87 0.80  CALCIUM 9.3 9.3   GFR: Estimated Creatinine Clearance: 119.4 mL/min (by C-G formula based on SCr of 0.8 mg/dL). Recent Labs  Lab 10/25/24 1146 10/25/24 1157 10/25/24 1433 10/30/24 0954  WBC 22.9*  --   --  17.5*  LATICACIDVEN  --  2.7* 1.2  --     Liver Function Tests: Recent Labs  Lab 10/25/24 1146 10/30/24 0954  AST 50* 40  ALT 92* 80*  ALKPHOS 75 83  BILITOT 0.2 0.2  PROT 7.4 7.3  ALBUMIN 4.4 4.4   No results for input(s): LIPASE, AMYLASE in the last 168 hours. No results for input(s): AMMONIA in the last 168 hours.  ABG    Component Value Date/Time   HCO3 24.5 (H) 02/10/2008 0030   TCO2 24 07/16/2010 0944   ACIDBASEDEF 1.0 02/10/2008 0030     Coagulation Profile: Recent Labs  Lab 10/25/24 1146  INR 0.9    Cardiac Enzymes: No results for input(s): CKTOTAL, CKMB, CKMBINDEX, TROPONINI in the last 168 hours.  HbA1C: Hgb A1c MFr Bld  Date/Time Value Ref Range Status  03/25/2011 06:20 AM (H) <5.7 % Final   5.8 (NOTE)  According to the ADA Clinical Practice Recommendations for 2011, when HbA1c is used as a screening test:   >=6.5%   Diagnostic of Diabetes Mellitus           (if abnormal result  is confirmed)  5.7-6.4%   Increased risk of developing Diabetes Mellitus  References:Diagnosis and Classification of Diabetes Mellitus,Diabetes Care,2011,34(Suppl 1):S62-S69 and Standards of Medical Care in         Diabetes - 2011,Diabetes Care,2011,34  (Suppl 1):S11-S61.    CBG: Recent Labs  Lab 10/30/24 0952  GLUCAP 96    Review of Systems:   Not obtained  Past Medical History:  He,  has a past medical history of Anxiety, Asthma, Chronic back pain, Depression, Hypertension, Seizure (HCC), and Suicidal behavior.   Surgical History:  History reviewed. No  pertinent surgical history.   Social History:   reports that he has been smoking cigarettes. He has a 0.3 pack-year smoking history. He has never used smokeless tobacco. He reports that he does not drink alcohol and does not use drugs.   Family History:  His family history includes Diabetes in his mother; Hypotension in his mother.   Allergies Allergies  Allergen Reactions   Tramadol     unknown     Home Medications  Prior to Admission medications   Medication Sig Start Date End Date Taking? Authorizing Provider  albuterol  (VENTOLIN  HFA) 108 (90 Base) MCG/ACT inhaler Inhale 1-2 puffs into the lungs every 6 (six) hours as needed for wheezing or shortness of breath. 10/22/24   Hazen Darryle BRAVO, FNP  benzonatate  (TESSALON ) 100 MG capsule Take 1 capsule (100 mg total) by mouth every 8 (eight) hours as needed for cough. 10/22/24   Hazen Darryle BRAVO, FNP  HYDROcodone  bit-homatropine (HYCODAN) 5-1.5 MG/5ML syrup Take 5 mLs by mouth every 6 (six) hours as needed for cough. 10/25/24   Francesca Elsie CROME, MD  levETIRAcetam  (KEPPRA  XR) 500 MG 24 hr tablet Take 4 tablets (2,000 mg total) by mouth daily. 07/09/24   Gayland Lauraine PARAS, NP  Multiple Vitamin (MULTIVITAMIN) tablet Take 1 tablet by mouth daily.    [provider]     Thank you for this interesting consult. I have spent 60 minutes evaluating patient, reviewing chart, and discussing plan of care with patient, family, and primary medical team. If you have any questions or concerns please reach out to me via secure chat.  Paula Southerly, MD [facility]

## 2024-10-30 NOTE — Telephone Encounter (Signed)
 Needs follow up with any pulmonary provider for bronchitis.

## 2024-11-01 LAB — BORDETELLA PERTUSSIS PCR
B parapertussis, DNA: NEGATIVE
B pertussis, DNA: NEGATIVE

## 2024-11-01 NOTE — Telephone Encounter (Signed)
 Scheduled 12/17 with AO. Patient aware of location.

## 2024-11-04 ENCOUNTER — Ambulatory Visit: Admitting: Nurse Practitioner

## 2024-11-06 ENCOUNTER — Telehealth: Payer: Self-pay | Admitting: Pulmonary Disease

## 2024-11-06 ENCOUNTER — Other Ambulatory Visit: Payer: Self-pay | Admitting: Acute Care

## 2024-11-06 ENCOUNTER — Ambulatory Visit: Payer: Self-pay

## 2024-11-06 ENCOUNTER — Telehealth: Payer: Self-pay | Admitting: Acute Care

## 2024-11-06 ENCOUNTER — Encounter: Payer: Self-pay | Admitting: Pulmonary Disease

## 2024-11-06 ENCOUNTER — Ambulatory Visit: Admitting: Pulmonary Disease

## 2024-11-06 VITALS — BP 130/70 | HR 124 | Temp 97.8°F | Ht 68.0 in | Wt 218.2 lb

## 2024-11-06 DIAGNOSIS — R051 Acute cough: Secondary | ICD-10-CM

## 2024-11-06 DIAGNOSIS — J209 Acute bronchitis, unspecified: Secondary | ICD-10-CM | POA: Diagnosis not present

## 2024-11-06 DIAGNOSIS — R052 Subacute cough: Secondary | ICD-10-CM

## 2024-11-06 DIAGNOSIS — R55 Syncope and collapse: Secondary | ICD-10-CM

## 2024-11-06 DIAGNOSIS — R054 Cough syncope: Secondary | ICD-10-CM | POA: Diagnosis not present

## 2024-11-06 DIAGNOSIS — F17210 Nicotine dependence, cigarettes, uncomplicated: Secondary | ICD-10-CM

## 2024-11-06 DIAGNOSIS — R52 Pain, unspecified: Secondary | ICD-10-CM | POA: Diagnosis not present

## 2024-11-06 MED ORDER — HYDROCODONE BIT-HOMATROP MBR 5-1.5 MG/5ML PO SOLN: 5.0000 mL | Freq: Three times a day (TID) | ORAL | 0 refills | Status: DC | PRN

## 2024-11-06 MED ORDER — HYDROCODONE BIT-HOMATROP MBR 5-1.5 MG/5ML PO SOLN: 5.0000 mL | Freq: Four times a day (QID) | ORAL | 0 refills | Status: AC | PRN

## 2024-11-06 MED ORDER — BUDESONIDE-FORMOTEROL FUMARATE 160-4.5 MCG/ACT IN AERO: 2.0000 | INHALATION_SPRAY | Freq: Two times a day (BID) | RESPIRATORY_TRACT | 3 refills | Status: DC

## 2024-11-06 NOTE — Telephone Encounter (Signed)
 FYI Only or Action Required?: on call provider Lauraine Lites NP paged, states she will call pharmacy.  Patient is followed in Pulmonology for cough, last seen on 11/06/2024 by Neda Jennet LABOR, MD.  Called Nurse Triage reporting Medication Problem.     Copied from CRM #[id number]. Topic: Clinical - Prescription Issue >> Nov 06, 2024  2:57 PM Devaughn RAMAN wrote: Reason for CRM: Pt calling in regards to Symbicort  and hydrocodone . Pt stated the hydrocodone  medication needs the diagnosis code In order to be dispensed for the pt and pt stated the pharmacy does not have the Symbicort  on file for the pt. Please f/u with pt regarding this. >> Nov 06, 2024  5:25 PM Joesph PARAS wrote: Patient is calling once again to request that rx be corrected and sent right this moment. Patient is upset that he has ben waiting all day for office to correct a mistake. Patient is VERY upset and wants this corrected now. Sending to triage.  Reason for Disposition  [1] Prescription not at pharmacy AND [2] was prescribed by doctor (or NP/PA) recently  (Exception: Triager has access to EMR and prescription is recorded there. Go to Home Care and confirm prescription for pharmacy.)  Answer Assessment - Initial Assessment Questions 1. DRUG NAME: What medicine do you need to have refilled?     Hydrocodone    4. PRESCRIBER: Who prescribed it? Note: The prescribing doctor or group is responsible for refill approvals.SABRA Neda, Jennet LABOR, MD  5. PHARMACY: Have you contacted your pharmacy (drugstore)? Note: Some pharmacies will contact the doctor (or NP/PA).      Yes, they stated the diagnosis code was not given and needs to be communicated to fill the medication  Lauraine Lites NP - provider on call #[phone] Called at 1640, she is to resubmit the order Pt called at 1651 and informed that Rx has been resubmitted.  Protocols used: Medication Refill and Renewal Call-A-AH

## 2024-11-06 NOTE — Telephone Encounter (Signed)
 Patient was seen today by Dr. Neda - office visit.  Will close encounter.

## 2024-11-06 NOTE — Patient Instructions (Signed)
 Follow-up in about 6 weeks  I did increase your Symbicort  from Symbicort  80-1 60 -It is still 2 puffs twice a day -Once your symptoms are much better you can go back to the Symbicort  80  I did send you a prescription for prednisone  40 mg for 7 days  I did refill the hydrocodone  for your pain and discomfort just a one-time  Call us  with significant concerns  This is all about buying time for the airway to settle down from the inflammation.  Do not go back to driving until you have many days of respite from the coughing and passing out  The goal/plan will be to be able to get you off the inhalers once things settle down  Make sure you do not go back to smoking

## 2024-11-06 NOTE — Telephone Encounter (Signed)
 Received an after-hours call from [facility] regarding pharmacy refusing to fill prescription for Hycodan as there was no diagnosis. Previous prescription has been discontinued New prescription placed for Hycodan syrup, Take 5 mLs by mouth every 6 (six) hours as needed for cough (for cough ( R05.2) . 120 cc dispensed 0 refills Prescription phoned into preferred pharmacy. Nothing further needed. I called the patient and let him know not to drive while taking the medication. He verbalized understanding.

## 2024-11-06 NOTE — Progress Notes (Signed)
 [name]    [id number]    March 05, 1971  Primary Care Physician:Patient, No Pcp Per  Referring Physician: No referring provider defined for this encounter.  Chief complaint:   Patient being seen for cough syncope  Discussed the use of AI scribe software for clinical note transcription with the patient, who gave verbal consent to proceed.  History of Present Illness [name] is a 53 year old male who presents with episodes of syncope associated with coughing.  He has been experiencing episodes of syncope associated with coughing for approximately two weeks. These episodes occur when he starts coughing, leading to a sensation of being unable to breathe, followed by syncope. He estimates having passed out about twenty times since the onset of symptoms. The frequency of these episodes has decreased recently, but he still experienced two episodes the previous night while on the toilet, waking up on the floor after about thirty seconds.  Initially, he sought care at a clinic where he was prescribed albuterol  and another unspecified medication. Blood work revealed a high white blood cell count, prompting a referral to the emergency room where he underwent an EKG and chest X-ray. He reports that he was told he had bronchitis and completed two courses of doxycycline , finishing the last dose this morning. He also completed a course of prednisone , which he reports did not cause insomnia.  He has a seizure disorder that began at age 60, for which he takes Keppra  daily along with multivitamins. He denies that the current episodes are similar to his seizures. He has a history of smoking, having quit two weeks ago, noting that even a single puff of a cigarette would exacerbate his coughing episodes.  He is currently using Symbicort  and albuterol  inhalers, with instructions to use Symbicort  two puffs twice a day and albuterol  as needed up to four times a day. He also takes Tessalon  Perles  for cough and has requested a refill of hydrocodone  for pain management, which he uses sparingly for pain following syncope episodes.  He has not driven in the past two weeks due to the risk of syncope while driving. He is concerned about the potential danger to himself and others if he were to lose consciousness while operating a vehicle.  Was not having any significant problems until 2 weeks ago when he started having the cough  Only started smoking at age 51, denies underlying chronic lung disease No past history of bronchitis  Only history significant for seizures  Outpatient Encounter Medications as of 11/06/2024  Medication Sig   albuterol  (VENTOLIN  HFA) 108 (90 Base) MCG/ACT inhaler Inhale 1-2 puffs into the lungs every 6 (six) hours as needed for wheezing or shortness of breath.   benzonatate  (TESSALON ) 100 MG capsule Take 1 capsule (100 mg total) by mouth every 8 (eight) hours as needed for cough.   budesonide -formoterol  (SYMBICORT ) 160-4.5 MCG/ACT inhaler Inhale 2 puffs into the lungs every 12 (twelve) hours.   budesonide -formoterol  (SYMBICORT ) 80-4.5 MCG/ACT inhaler Inhale 2 puffs into the lungs 2 (two) times daily.   doxycycline  (VIBRAMYCIN ) 100 MG capsule Take 100 mg by mouth 2 (two) times daily.   levETIRAcetam  (KEPPRA  XR) 500 MG 24 hr tablet Take 4 tablets (2,000 mg total) by mouth daily.   Multiple Vitamin (MULTIVITAMIN) tablet Take 1 tablet by mouth daily.   predniSONE  (DELTASONE ) 10 MG tablet One tab daily   predniSONE  (DELTASONE ) 20 MG tablet 2 tabs po daily x 4 days   [  DISCONTINUED] HYDROcodone -acetaminophen  (NORCO/VICODIN) 5-325 MG tablet Take 1-2 tablets by mouth every 6 (six) hours as needed.   azithromycin  (ZITHROMAX  Z-PAK) 250 MG tablet 2 po day one, then 1 daily x 4 days (Patient not taking: Reported on 11/06/2024)   HYDROcodone  bit-homatropine (HYCODAN) 5-1.5 MG/5ML syrup Take 5 mLs by mouth every 6 (six) hours as needed for cough. (Patient not taking: Reported on  11/06/2024)   HYDROcodone -acetaminophen  (NORCO/VICODIN) 5-325 MG tablet Take 1-2 tablets by mouth every 6 (six) hours as needed.   No facility-administered encounter medications on file as of 11/06/2024.    Allergies as of 11/06/2024 - Review Complete 11/06/2024  Allergen Reaction Noted   Tramadol  01/04/2012    Past Medical History:  Diagnosis Date   Anxiety    Asthma    Chronic back pain    Depression    Hypertension    Seizure (HCC)    Suicidal behavior     No past surgical history on file.  Family History  Problem Relation Age of Onset   Hypotension Mother    Diabetes Mother     Social History   Socioeconomic History   Marital status: Divorced    Spouse name: Not on file   Number of children: 2   Years of education: 12+   Highest education level: Not on file  Occupational History   Not on file  Tobacco Use   Smoking status: Some Days    Current packs/day: 0.25    Average packs/day: 0.3 packs/day for 1 year (0.3 ttl pk-yrs)    Types: Cigarettes   Smokeless tobacco: Never   Tobacco comments:    09/28/17  5 cigs daily  Vaping Use   Vaping status: Never Used  Substance and Sexual Activity   Alcohol use: No   Drug use: No   Sexual activity: Yes    Birth control/protection: Condom  Other Topics Concern   Not on file  Social History Narrative   Patient lives at home alone.    Patient has 2 children.    Patient is divorced.    Patient is right handed.    Patient has College degree.    Social Drivers of Health   Tobacco Use: High Risk (11/06/2024)   Patient History    Smoking Tobacco Use: Some Days    Smokeless Tobacco Use: Never    Passive Exposure: Not on file  Financial Resource Strain: Not on file  Food Insecurity: Not on file  Transportation Needs: Not on file  Physical Activity: Not on file  Stress: Not on file  Social Connections: Not on file  Intimate Partner Violence: Not on file  Depression (EYV7-0): Not on file  Alcohol Screen: Not  on file  Housing: Not on file  Utilities: Not on file  Health Literacy: Not on file    Review of Systems  Respiratory:  Positive for cough.     Vitals:   11/06/24 1012  BP: 130/70  Pulse: (!) 124  Temp: 97.8 F (36.6 C)  SpO2: 96%     Physical Exam Constitutional:      Appearance: He is obese.  HENT:     Head: Normocephalic.     Nose: Nose normal.     Mouth/Throat:     Mouth: Mucous membranes are moist.  Eyes:     General: No scleral icterus. Cardiovascular:     Rate and Rhythm: Normal rate and regular rhythm.     Heart sounds: No murmur heard.    No  friction rub.  Pulmonary:     Effort: No respiratory distress.     Breath sounds: No stridor. Rhonchi present. No wheezing.  Musculoskeletal:     Cervical back: No rigidity or tenderness.  Neurological:     General: No focal deficit present.     Mental Status: He is alert.  Psychiatric:        Mood and Affect: Mood normal.    Data Reviewed: Recent records by Dr. Catherine reviewed-10/30/2024  Urgent care visit on 10/22/2024 reviewed, CT chest 10/25/2024 reviewed by myself-notable for some atelectasis, bronchial thickening  Chest x-ray 10/25/2024-reviewed by myself with no acute infiltrate  CBC 10/30/2024 with white count of 17.5  Assessment and Plan Assessment & Plan Acute bronchitis with cough-induced syncope Acute bronchitis with episodes of cough-induced syncope, likely due to increased intrathoracic pressure from severe coughing. Symptoms have improved but syncope episodes persist. No evidence of pneumonia or other lung disease. Smoking cessation has been beneficial. - Increased Symbicort  to 160 mcg, two puffs twice daily. - Use albuterol  as needed, up to four times daily. - Continue Tessalon  Perles for cough management. - Educated on proper inhaler technique using online resources. - Prescribed an additional seven days of prednisone .  Nicotine  dependence, cigarettes Nicotine  dependence with recent  cessation. Smoking cessation has been beneficial in reducing symptoms of bronchitis and syncope. - Encouraged continued smoking cessation.  Pain due to falls from syncope Pain secondary to falls from syncope, affecting face and body. Pain management with hydrocodone  as needed. - Refilled hydrocodone  prescription for pain management.   No orders of the defined types were placed in this encounter.   Significant changes during this visit Increase Symbicort  to 160 from 80  Inhaler technique was reviewed and reinforced - Encouraged him to watch a few videos to reinforce how to use the inhalers correctly  Addition of prednisone  to be used for another 5 to 7 days  Follow-up in about 6 weeks  Encouraged to give us  a call with any significant concerns    Jennet Epley MD [facility] 11/06/2024, 10:37 AM  CC: No ref. provider found

## 2024-11-07 ENCOUNTER — Telehealth: Payer: Self-pay

## 2024-11-07 ENCOUNTER — Other Ambulatory Visit: Payer: Self-pay | Admitting: Pulmonary Disease

## 2024-11-07 MED ORDER — HYDROCODONE-ACETAMINOPHEN 5-325 MG PO TABS: 1.0000 | ORAL_TABLET | Freq: Four times a day (QID) | ORAL | 0 refills | Status: AC | PRN

## 2024-11-07 MED ORDER — BUDESONIDE-FORMOTEROL FUMARATE 160-4.5 MCG/ACT IN AERO: 2.0000 | INHALATION_SPRAY | Freq: Two times a day (BID) | RESPIRATORY_TRACT | 3 refills | Status: AC

## 2024-11-07 NOTE — Telephone Encounter (Signed)
 Noted. Nothing further needed.

## 2024-11-07 NOTE — Telephone Encounter (Unsigned)
 Copied from CRM [id number]. Topic: Clinical - Prescription Issue >> Nov 06, 2024 11:37 AM Isabell A wrote: Reason for CRM: Patient states he was unable to get HYDROcodone -acetaminophen  (NORCO/VICODIN) 5-325 MG tablet [[id number]] - missing diagnons & Symbicort  did not go over to the pharmacy as well.    Callback number: [phone] >> Nov 06, 2024 12:50 PM Rilla B wrote: Patient call back checking on status of prescriptions.  States he is at the pharmacy waiting because he don't want to have to drive back. He states he just saw Dr Neda and scripts should have been done and wanted me to call provider. I did try CAL and no answer. Advised patient that team is out to lunch and I would at least send a message over.

## 2024-11-08 ENCOUNTER — Inpatient Hospital Stay: Admitting: Hematology and Oncology

## 2024-11-08 ENCOUNTER — Inpatient Hospital Stay

## 2024-11-08 NOTE — Telephone Encounter (Signed)
 I called [facility] in [place] and verified that the Symbicort  is ready and the Hydrocodone  will be ready today.  Nothing further needed.  I called and spoke with the patient and made him aware that the Symbicort  is ready for pick up and the Hydrocodone  will be ready later today.  He verbalized understanding.  Nothing further needed.

## 2024-11-11 ENCOUNTER — Telehealth: Payer: Self-pay | Admitting: Neurology

## 2024-11-11 NOTE — Telephone Encounter (Signed)
 Pt called to request to speak to NP Pt stated he was suppose to get blood work done and not sure if he still needs blood work due  he was sick during the last time he got his blood drawn the patient wanted to followup with NP

## 2024-11-12 NOTE — Telephone Encounter (Signed)
 Please call, At his last routine visit in August, his liver enzymes were elevated. I wanted them to be rechecked. I can see he was in the ER recently for cough syncope. ALT is still elevated at 80, but AST is normal. He needs to establish with a primary care doctor to follow this. May require further work up/monitoring. I can place referral if he has PCP in mind. Thanks

## 2024-11-12 NOTE — Telephone Encounter (Signed)
 1ST ATTEMPT BY HF FULL MAILBOX

## 2024-11-13 NOTE — Telephone Encounter (Signed)
 Mailbox full, could not LVM

## 2024-11-18 ENCOUNTER — Telehealth: Payer: Self-pay | Admitting: Neurology

## 2024-11-18 NOTE — Telephone Encounter (Signed)
 I called pt and I relayed message. He states that he does not have one but he has seen [employer] and will check with him about a pcp recommendation since he will be seeing him again for follow up.  He appreciated follow up.

## 2024-11-18 NOTE — Telephone Encounter (Signed)
 Pt has returned call to [place], [place].  Pt is asking for a call back.

## 2024-11-25 ENCOUNTER — Emergency Department (HOSPITAL_COMMUNITY)
Admission: EM | Admit: 2024-11-25 | Discharge: 2024-11-25 | Disposition: A | Discharge status: Home / Self Care | Attending: Emergency Medicine | Admitting: Emergency Medicine

## 2024-11-25 ENCOUNTER — Other Ambulatory Visit: Payer: Self-pay

## 2024-11-25 DIAGNOSIS — Z4802 Encounter for removal of sutures: Secondary | ICD-10-CM | POA: Diagnosis present

## 2024-11-25 NOTE — ED Triage Notes (Signed)
 Pt states he had sutures done to left eyebrow at least 2 weeks ago and is here to have them removed, pt was unaware of when they should be taken out

## 2024-11-25 NOTE — ED Provider Notes (Signed)
 " [facility] AT [facility] Provider Note   CSN: [id number] Arrival date & time: 11/25/24  1157     Patient presents with: Suture / Staple Removal   [name] is a 54 y.o. male.  Patient is a 54 year old male who presents to the ED for suture removal to the left eyebrow.  Notes placed approximately 2 months ago after syncopal episode.  Has not had any bleeding or drainage.  Denies fevers.  No further complaints.    Suture / Staple Removal       Prior to Admission medications  Medication Sig Start Date End Date Taking? Authorizing Provider  HYDROcodone -acetaminophen  (NORCO/VICODIN) 5-325 MG tablet Take 1 tablet by mouth every 6 (six) hours as needed. 11/07/24   Neda Jennet LABOR, MD  albuterol  (VENTOLIN  HFA) 108 (90 Base) MCG/ACT inhaler Inhale 1-2 puffs into the lungs every 6 (six) hours as needed for wheezing or shortness of breath. 10/22/24   Hazen Darryle BRAVO, FNP  azithromycin  (ZITHROMAX  Z-PAK) 250 MG tablet 2 po day one, then 1 daily x 4 days Patient not taking: Reported on 11/06/2024 10/30/24   Armenta Canning, MD  benzonatate  (TESSALON ) 100 MG capsule Take 1 capsule (100 mg total) by mouth every 8 (eight) hours as needed for cough. 10/22/24   Hazen Darryle BRAVO, FNP  budesonide -formoterol  (SYMBICORT ) 160-4.5 MCG/ACT inhaler Inhale 2 puffs into the lungs every 12 (twelve) hours. 11/07/24   Neda Jennet LABOR, MD  doxycycline  (VIBRAMYCIN ) 100 MG capsule Take 100 mg by mouth 2 (two) times daily. 10/30/24   [provider]  HYDROcodone  bit-homatropine (HYCODAN) 5-1.5 MG/5ML syrup Take 5 mLs by mouth every 6 (six) hours as needed for cough (for cough ( R05.2)). 11/06/24   Ruthell Lauraine FALCON, NP  levETIRAcetam  (KEPPRA  XR) 500 MG 24 hr tablet Take 4 tablets (2,000 mg total) by mouth daily. 07/09/24   Gayland Lauraine JINNY, NP  Multiple Vitamin (MULTIVITAMIN) tablet Take 1 tablet by mouth daily.    [provider]  predniSONE  (DELTASONE ) 10 MG tablet  One tab daily 11/06/24   Neda Jennet A, MD  predniSONE  (DELTASONE ) 20 MG tablet 2 tabs po daily x 4 days 10/30/24   Armenta Canning, MD    Allergies: Tramadol    Review of Systems  Skin:  Positive for wound.  All other systems reviewed and are negative.   Updated Vital Signs BP (!) 134/98 (BP Location: Left Arm)   Pulse (!) 109   Temp 98.8 F (37.1 C) (Oral)   Resp 16   SpO2 95%   Physical Exam HENT:     Head: Normocephalic.     Comments: 1 to 2 cm laceration present to the left eyebrow with 4 sutures in place.  Healing well with no dehiscence or drainage.  No surrounding signs of cellulitis. Neurological:     Mental Status: He is oriented to person, place, and time.  Psychiatric:        Mood and Affect: Mood normal.        Behavior: Behavior normal.     (all labs ordered are listed, but only abnormal results are displayed) Labs Reviewed - No data to display  EKG: None  Radiology: No results found.   Suture Removal  Date/Time: 11/25/2024 12:29 PM  Performed by: Neysa Thersia RAMAN, PA-C Authorized by: Neysa Thersia RAMAN, PA-C   Consent:    Consent obtained:  Verbal   Consent given by:  Patient Location:    Location:  Head/neck  Head/neck location:  Eyebrow   Eyebrow location:  L eyebrow Procedure details:    Wound appearance:  No signs of infection and good wound healing   Number of sutures removed:  4 Post-procedure details:    Post-removal:  No dressing applied   Procedure completion:  Tolerated well, no immediate complications    Medications Ordered in the ED - No data to display                                 Medical Decision Making Patient is a 54 year old male who presents to the ED for suture removal to the left eyebrow.  Sutures were placed about 2 weeks ago after a syncopal episode.  On exam patient is alert and well-appearing.  Physical exam noted above.  Wound is healed well with no dehiscence or drainage.  Sutures removed.  Mildly  tachycardic but no other associated symptoms.  Notes his heart rate is always elevated at the doctor.  Otherwise stable for discharge home.  Instructions provided.  Advised PCP follow-up in 1 to 2 weeks for any concerns.  Return precautions provided.      Final diagnoses:  None    ED Discharge Orders     None          Neysa Thersia GORMAN DEVONNA 11/25/24 1231    Cottie Donnice PARAS, MD 11/25/24 1251  "

## 2025-02-11 ENCOUNTER — Ambulatory Visit: Admitting: Pulmonary Disease

## 2025-07-15 ENCOUNTER — Ambulatory Visit: Admitting: Neurology
